# Patient Record
Sex: Female | Born: 2005 | Hispanic: Yes | Marital: Single | Smoking: Never smoker
Health system: Southern US, Community
[De-identification: ages and names within clinical notes are randomized; demographics above are authoritative.]

## PROBLEM LIST (undated history)

## (undated) ENCOUNTER — Ambulatory Visit: Admission: EM

## (undated) DIAGNOSIS — G43909 Migraine, unspecified, not intractable, without status migrainosus: Secondary | ICD-10-CM

## (undated) DIAGNOSIS — J45909 Unspecified asthma, uncomplicated: Secondary | ICD-10-CM

## (undated) HISTORY — DX: Unspecified asthma, uncomplicated: J45.909

## (undated) HISTORY — DX: Migraine, unspecified, not intractable, without status migrainosus: G43.909

## (undated) HISTORY — PX: NO PAST SURGERIES: SHX2092

---
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
## 2023-02-14 ENCOUNTER — Other Ambulatory Visit: Payer: Self-pay

## 2023-02-14 ENCOUNTER — Encounter: Payer: Self-pay | Admitting: Emergency Medicine

## 2023-02-14 ENCOUNTER — Emergency Department
Admission: EM | Admit: 2023-02-14 | Discharge: 2023-02-14 | Disposition: A | Discharge status: Home / Self Care | Payer: Medicaid Other | Attending: Emergency Medicine | Admitting: Emergency Medicine

## 2023-02-14 ENCOUNTER — Emergency Department: Payer: Medicaid Other

## 2023-02-14 DIAGNOSIS — R55 Syncope and collapse: Secondary | ICD-10-CM

## 2023-02-14 DIAGNOSIS — E876 Hypokalemia: Secondary | ICD-10-CM | POA: Diagnosis not present

## 2023-02-14 DIAGNOSIS — Z1152 Encounter for screening for COVID-19: Secondary | ICD-10-CM | POA: Insufficient documentation

## 2023-02-14 DIAGNOSIS — G43819 Other migraine, intractable, without status migrainosus: Secondary | ICD-10-CM

## 2023-02-14 LAB — CBC
HCT: 42.9 % (ref 36.0–46.0)
Hemoglobin: 14.4 g/dL (ref 12.0–15.0)
MCH: 28.9 pg (ref 26.0–34.0)
MCHC: 33.6 g/dL (ref 30.0–36.0)
MCV: 86.1 fL (ref 80.0–100.0)
Platelets: 181 10*3/uL (ref 150–400)
RBC: 4.98 MIL/uL (ref 3.87–5.11)
RDW: 12.2 % (ref 11.5–15.5)
WBC: 7.3 10*3/uL (ref 4.0–10.5)
nRBC: 0 % (ref 0.0–0.2)

## 2023-02-14 LAB — BASIC METABOLIC PANEL
Anion gap: 11 (ref 5–15)
BUN: 10 mg/dL (ref 6–20)
CO2: 24 mmol/L (ref 22–32)
Calcium: 9.4 mg/dL (ref 8.9–10.3)
Chloride: 102 mmol/L (ref 98–111)
Creatinine, Ser: 0.82 mg/dL (ref 0.44–1.00)
GFR, Estimated: >60 mL/min (ref >60)
Glucose, Bld: 97 mg/dL (ref 70–99)
Potassium: 3.2 mmol/L — ABNORMAL LOW (ref 3.5–5.1)
Sodium: 137 mmol/L (ref 135–145)

## 2023-02-14 LAB — RESP PANEL BY RT-PCR (RSV, FLU A&B, COVID)  RVPGX2
Influenza A by PCR: NEGATIVE
Influenza B by PCR: NEGATIVE
Resp Syncytial Virus by PCR: NEGATIVE
SARS Coronavirus 2 by RT PCR: NEGATIVE

## 2023-02-14 LAB — CBG MONITORING, ED: Glucose-Capillary: 84 mg/dL (ref 70–99)

## 2023-02-14 LAB — URINALYSIS, ROUTINE W REFLEX MICROSCOPIC
Bilirubin Urine: NEGATIVE
Glucose, UA: NEGATIVE mg/dL
Hgb urine dipstick: NEGATIVE
Ketones, ur: 20 mg/dL — AB
Leukocytes,Ua: NEGATIVE
Nitrite: NEGATIVE
Protein, ur: NEGATIVE mg/dL
Specific Gravity, Urine: 1.009 (ref 1.005–1.030)
pH: 5 (ref 5.0–8.0)

## 2023-02-14 LAB — HEPATIC FUNCTION PANEL
ALT: 19 U/L (ref 0–44)
AST: 23 U/L (ref 15–41)
Albumin: 4.6 g/dL (ref 3.5–5.0)
Alkaline Phosphatase: 49 U/L (ref 38–126)
Bilirubin, Direct: <0.1 mg/dL (ref 0.0–0.2)
Total Bilirubin: 0.7 mg/dL (ref 0.3–1.2)
Total Protein: 8 g/dL (ref 6.5–8.1)

## 2023-02-14 LAB — POC URINE PREG, ED: Preg Test, Ur: NEGATIVE

## 2023-02-14 MED ORDER — POTASSIUM CHLORIDE CRYS ER 20 MEQ PO TBCR
40.0000 meq | EXTENDED_RELEASE_TABLET | Freq: Once | ORAL | Status: AC
  Administered 2023-02-14: 40 meq via ORAL
  Filled 2023-02-14: qty 2

## 2023-02-14 MED ORDER — KETOROLAC TROMETHAMINE 15 MG/ML IJ SOLN
15.0000 mg | Freq: Once | INTRAMUSCULAR | Status: AC
  Administered 2023-02-14: 15 mg via INTRAVENOUS
  Filled 2023-02-14: qty 1

## 2023-02-14 MED ORDER — DIPHENHYDRAMINE HCL 50 MG/ML IJ SOLN
12.5000 mg | Freq: Once | INTRAMUSCULAR | Status: AC
  Administered 2023-02-14: 12.5 mg via INTRAVENOUS
  Filled 2023-02-14: qty 1

## 2023-02-14 MED ORDER — SODIUM CHLORIDE 0.9 % IV BOLUS
1000.0000 mL | Freq: Once | INTRAVENOUS | Status: AC
  Administered 2023-02-14: 1000 mL via INTRAVENOUS

## 2023-02-14 MED ORDER — ACETAMINOPHEN 500 MG PO TABS
1000.0000 mg | ORAL_TABLET | Freq: Once | ORAL | Status: AC
  Administered 2023-02-14: 1000 mg via ORAL
  Filled 2023-02-14: qty 2

## 2023-02-14 MED ORDER — METOCLOPRAMIDE HCL 5 MG/ML IJ SOLN
10.0000 mg | Freq: Once | INTRAMUSCULAR | Status: AC
  Administered 2023-02-14: 10 mg via INTRAVENOUS
  Filled 2023-02-14: qty 2

## 2023-02-14 NOTE — ED Provider Notes (Signed)
[facility] Provider Note    Event Date/Time   First MD Initiated Contact with Patient 02/14/23 1605     (approximate)   History   Loss of Consciousness   HPI  Tara Pierce is a 17 y.o. female who comes in after having a syncopal episode around 2pm today  Patient has been trying to fast to try and lose weight to get in the [employer]-she has not ate anything today, drank 1 cup of coffee.  She then participated in a heavy workout, [employer] around 10AM and did not drink any water. She then didn't eat lunch.  After lunch, she was in class and she started to feel a little nauseous but denies any headaches and she reports that she think she lost consciousness and laid her head down on the desk.  After she came to she tried to stand up but felt really weak so they lowered her down on to the ground.  She then reports developing a headache. She reports not having LOC at that time and feeling her body shaking but being aware of it.  She was able to hear everything but had difficulty speaking and had tingling of her whole body except her left arm.  She reports that the tingling seems to be resolving except for still little bit on her bilateral legs (left thigh, right foot) and her right arm.  She reports that she feels some weakness in her right arm as well.  She does report history of migraines previously but that she is never needed to come to the ER for.  She reports that typically they do cause some right arm weakness and tingling similar to what she currently has now but she is never had her legs feel tingly with it.  She denies any other injuries to her neck but reports some chronic neck pain for years. Denies any new injuries to neck.     Physical Exam   Triage Vital Signs: ED Triage Vitals  Enc Vitals Group     BP 02/14/23 1557 (!) 130/93     Pulse Rate 02/14/23 1557 80     Resp 02/14/23 1557 18     Temp 02/14/23 1557 98.6 F (37 C)     Temp Source 02/14/23 1557  Oral     SpO2 02/14/23 1557 100 %     Weight --      Height --      Head Circumference --      Peak Flow --      Pain Score 02/14/23 1558 0     Pain Loc --      Pain Edu? --      Excl. in [place]? --     Most recent vital signs: Vitals:   02/14/23 1557  BP: (!) 130/93  Pulse: 80  Resp: 18  Temp: 98.6 F (37 C)  SpO2: 100%     General: Awake, no distress.  CV:  Good peripheral perfusion.  Resp:  Normal effort.  Abd:  No distention. Soft and non tender Other:  Patient has some weakness in the right arm as well as some subjective sensation changes in the right arm and bilateral legs.  Otherwise cranial nerves appear intact.   ED Results / Procedures / Treatments   Labs (all labs ordered are listed, but only abnormal results are displayed) Labs Reviewed  BASIC METABOLIC PANEL - Abnormal; Notable for the following components:      Result Value  Potassium 3.2 (*)    All other components within normal limits  RESP PANEL BY RT-PCR (RSV, FLU A&B, COVID)  RVPGX2  CBC  URINALYSIS, ROUTINE W REFLEX MICROSCOPIC  HEPATIC FUNCTION PANEL  CBG MONITORING, ED  POC URINE PREG, ED     EKG  My interpretation of EKG:  Sinus rate of 83 without any ST elevation or T wave inversions with sinus arrhythmia  RADIOLOGY I have reviewed the CT had personally interpreted and no evidence of intracranial hemorrhage  PROCEDURES:  Critical Care performed: No  .1-3 Lead EKG Interpretation  Performed by: Vanessa Landen, MD Authorized by: Vanessa C[place]wba, MD     Interpretation: normal     ECG rate:  80   ECG rate assessment: normal     Rhythm: sinus rhythm     Ectopy: none     Conduction: normal      MEDICATIONS ORDERED IN ED: Medications  potassium chloride SA (KLOR-CON M) CR tablet 40 mEq (40 mEq Oral Given 02/14/23 1638)  sodium chloride 0.9 % bolus 1,000 mL (0 mLs Intravenous Stopped 02/14/23 1819)  metoCLOPramide (REGLAN) injection 10 mg (10 mg Intravenous Given 02/14/23 1718)   diphenhydrAMINE (BENADRYL) injection 12.5 mg (12.5 mg Intravenous Given 02/14/23 1718)  ketorolac (TORADOL) 15 MG/ML injection 15 mg (15 mg Intravenous Given 02/14/23 1920)  acetaminophen (TYLENOL) tablet 1,000 mg (1,000 mg Oral Given 02/14/23 1919)     IMPRESSION / MDM / ASSESSMENT AND PLAN / ED COURSE  I reviewed the triage vital signs and the nursing notes.   Patient's presentation is most consistent with acute presentation with potential threat to life or bodily function.  Patient comes in with concern for syncope.  Stroke code was not called given her examination seems more consistent with complex migraine and her neuroexam is affecting areas that would not be consistent with a stroke with some sensation changes on her left thigh right foot and right arm.  However given the headache I will get CT head to rule out intracranial hemorrhage given it has been within 6 hours of onset of headache.   however have a stronger suspicion for complex migraine that was brought upon by dehydration and fasting given she reports similar symptoms of R arm weakness/tingling and headaches previously.  Patient will be given Reglan cocktail.  Glucose was 84.  BMP shows slightly low potassium at 3.2.  CBC reassuring  5:58 PM reevaluated patient weakness was resolved and the right arm weakness was resolved in the right leg.  Sensation is also resolved except for a little bit of tingling in her bilateral toes.  Pregnancy test is negative.  Urine is reassuring.  7:31 PM the patient continues to have normal neurological examination at this time.  She will follow-up outpatient with neurology.  Do not feel that we can do MRI given symptoms have since all resolved this seems more likely complex migraine.  Given resolution of symptoms do not feel that this represents a dissection.  She denies any new neck injuries.  I really think this is a complex migraine related to not eating or drinking and having exercising.  7:47  PM patient denies neurological symptoms.  She is now eating and feels much better.  Will do ambulatory trial and discharge home.  Patient reports feeling at her baseline self.  The patient is on the cardiac monitor to evaluate for evidence of arrhythmia and/or significant heart rate changes.      FINAL CLINICAL IMPRESSION(S) / ED DIAGNOSES  Final diagnoses:  Syncope and collapse  Other migraine without status migrainosus, intractable     Rx / DC Orders   ED Discharge Orders     None        Note:  This document was prepared using Dragon voice recognition software and may include unintentional dictation errors.   Vanessa [place], MD 02/14/23 [phone]

## 2023-02-14 NOTE — Discharge Instructions (Addendum)
I suspect that this happened due to your dieting not eating or drinking while working out heavily.  It is important that you eat a balanced diet and drink fluids especially with working out.  I still suspect you have something called complex migraines and I have placed a number for neurology.  Please call them to make a follow-up appointment however to confirm this and rule out other causes if needed and return to the ER if you develop return of syncopal episodes, numbness, weakness or any other concerns

## 2023-02-14 NOTE — ED Triage Notes (Signed)
Patient to ED via [facility] from school after a syncopal episode. Patient states she has been fasting to try and lose weight to get into the [employer]- has not ate today. C/o headache, nausea, and numbness in arms and legs. Patient states that she sometimes gets a migraine and makes her "fall out."

## 2023-02-14 NOTE — ED Notes (Signed)
Pt to CT

## 2023-02-14 NOTE — ED Notes (Signed)
ED Provider at bedside. 

## 2023-03-12 ENCOUNTER — Other Ambulatory Visit: Payer: Self-pay | Admitting: Student

## 2023-03-12 DIAGNOSIS — G43109 Migraine with aura, not intractable, without status migrainosus: Secondary | ICD-10-CM

## 2023-03-17 ENCOUNTER — Encounter: Payer: Self-pay | Admitting: Neurology

## 2023-03-22 ENCOUNTER — Ambulatory Visit
Admission: RE | Admit: 2023-03-22 | Discharge: 2023-03-22 | Disposition: A | Discharge status: Home / Self Care | Payer: Medicaid Other | Source: Ambulatory Visit | Attending: Student | Admitting: Student

## 2023-03-22 DIAGNOSIS — G43109 Migraine with aura, not intractable, without status migrainosus: Secondary | ICD-10-CM

## 2023-06-03 ENCOUNTER — Other Ambulatory Visit: Payer: Self-pay

## 2023-06-03 ENCOUNTER — Emergency Department
Admission: EM | Admit: 2023-06-03 | Discharge: 2023-06-03 | Disposition: A | Discharge status: Home / Self Care | Payer: Medicaid Other | Attending: Emergency Medicine | Admitting: Emergency Medicine

## 2023-06-03 DIAGNOSIS — G43909 Migraine, unspecified, not intractable, without status migrainosus: Secondary | ICD-10-CM | POA: Diagnosis not present

## 2023-06-03 DIAGNOSIS — R519 Headache, unspecified: Secondary | ICD-10-CM

## 2023-06-03 DIAGNOSIS — Z1152 Encounter for screening for COVID-19: Secondary | ICD-10-CM | POA: Diagnosis not present

## 2023-06-03 LAB — SARS CORONAVIRUS 2 BY RT PCR: SARS Coronavirus 2 by RT PCR: NEGATIVE

## 2023-06-03 LAB — PREGNANCY, URINE: Preg Test, Ur: NEGATIVE

## 2023-06-03 MED ORDER — SODIUM CHLORIDE 0.9 % IV BOLUS
1000.0000 mL | Freq: Once | INTRAVENOUS | Status: AC
  Administered 2023-06-03: 1000 mL via INTRAVENOUS

## 2023-06-03 MED ORDER — METOCLOPRAMIDE HCL 5 MG/ML IJ SOLN
10.0000 mg | Freq: Once | INTRAMUSCULAR | Status: AC
  Administered 2023-06-03: 10 mg via INTRAVENOUS
  Filled 2023-06-03: qty 2

## 2023-06-03 MED ORDER — KETOROLAC TROMETHAMINE 30 MG/ML IJ SOLN
30.0000 mg | Freq: Once | INTRAMUSCULAR | Status: AC
  Administered 2023-06-03: 30 mg via INTRAVENOUS
  Filled 2023-06-03: qty 1

## 2023-06-03 MED ORDER — DIPHENHYDRAMINE HCL 50 MG/ML IJ SOLN
50.0000 mg | Freq: Once | INTRAMUSCULAR | Status: AC
  Administered 2023-06-03: 50 mg via INTRAVENOUS
  Filled 2023-06-03: qty 1

## 2023-06-03 NOTE — ED Triage Notes (Signed)
Pt presents to ER with c/o posterior HA that started around 0100.  Pt reports she had a "knot" to left upper neck that when she pressed on, caused her to feel numbness/tingling all over her body.  Pt states she is still having some tingling all over, but states that she does have full sensation at this time.  Pt endorses hx of migraines, and takes rizatriptan.  States she took a dose when her HA started.  Pt endorses nausea, and sensitivity to light/sound at this time.  Pt otherwise A&O x4 and in NAD.

## 2023-06-03 NOTE — ED Provider Notes (Signed)
[facility] Provider Note    Event Date/Time   First MD Initiated Contact with Patient 06/03/23 [phone]     (approximate)  History   Chief Complaint: Headache  HPI  Tara Pierce is a 17 y.o. female with a past medical history of migraines who presents emergency department for headache.  According to the patient since around 1:00 this morning she has been experiencing a headache along with nausea and vomiting photo and phonophobia.  Patient states this feels typical of her migraines but is more severe than her typical migraine.  Patient states she tried her home medications without relief so she came to the emergency department.  Patient states she has had to come to the emergency department previously and mom states the last of which was approximately 2 months ago for headache.  States she responds well to the "migraine cocktail" medications.  Patient denies any recent fever cough or congestion.  Denies any weakness or numbness of either arm or legs but does state at times paresthesias tingling which is also typical of her migraines.  Physical Exam   Triage Vital Signs: ED Triage Vitals  Enc Vitals Group     BP 06/03/23 0214 123/89     Pulse Rate 06/03/23 0214 88     Resp 06/03/23 0214 19     Temp 06/03/23 0214 98.6 F (37 C)     Temp Source 06/03/23 0214 Oral     SpO2 06/03/23 0214 98 %     Weight 06/03/23 0215 190 lb (86.2 kg)     Height 06/03/23 0215 5\' 3"  (1.6 m)     Head Circumference --      Peak Flow --      Pain Score 06/03/23 0215 10     Pain Loc --      Pain Edu? --      Excl. in [place]? --     Most recent vital signs: Vitals:   06/03/23 0214  BP: 123/89  Pulse: 88  Resp: 19  Temp: 98.6 F (37 C)  SpO2: 98%    General: Awake, no distress.  CV:  Good peripheral perfusion.  Regular rate and rhythm  Resp:  Normal effort.  Equal breath sounds bilaterally.  Abd:  No distention.  Soft, nontender.  No rebound or  guarding. Other:  Equal grip strength bilaterally.  No cranial nerve deficits.   ED Results / Procedures / Treatments   MEDICATIONS ORDERED IN ED: Medications  ketorolac (TORADOL) 30 MG/ML injection 30 mg (has no administration in time range)  metoCLOPramide (REGLAN) injection 10 mg (has no administration in time range)  diphenhydrAMINE (BENADRYL) injection 50 mg (has no administration in time range)  sodium chloride 0.9 % bolus 1,000 mL (has no administration in time range)     IMPRESSION / MDM / ASSESSMENT AND PLAN / ED COURSE  I reviewed the triage vital signs and the nursing notes.  Patient's presentation is most consistent with acute presentation with potential threat to life or bodily function.  Patient presents to the emergency department for headache.  History of migraines with migraine symptoms tonight.  Overall the patient appears well, reassuring physical exam reassuring neurological exam.  We will treat with Toradol Reglan Benadryl and continue to closely monitor.  We will IV hydrate.  Given the patient's headache we will also obtain a COVID swab as a precaution given a recent uptick in the community.  She states she is feeling much better after medications.  Does not want to wait for the COVID swab to resolve and is ready to go home.  Will discharge patient home with outpatient follow-up.  Patient agreeable to plan of care.  FINAL CLINICAL IMPRESSION(S) / ED DIAGNOSES   Migraine headache  Note:  This document was prepared using Dragon voice recognition software and may include unintentional dictation errors.   Minna Antis, MD 06/03/23 [phone]

## 2023-06-25 ENCOUNTER — Other Ambulatory Visit: Payer: Self-pay | Admitting: Student

## 2023-06-25 DIAGNOSIS — R519 Headache, unspecified: Secondary | ICD-10-CM

## 2024-06-25 ENCOUNTER — Ambulatory Visit

## 2024-06-25 VITALS — BP 123/73 | Ht 63.0 in | Wt 213.0 lb

## 2024-06-25 DIAGNOSIS — Z309 Encounter for contraceptive management, unspecified: Secondary | ICD-10-CM

## 2024-06-25 DIAGNOSIS — Z3201 Encounter for pregnancy test, result positive: Secondary | ICD-10-CM

## 2024-06-25 LAB — PREGNANCY, URINE: Preg Test, Ur: POSITIVE — AB

## 2024-06-25 MED ORDER — PRENATAL 27-0.8 MG PO TABS: 1.0000 | ORAL_TABLET | Freq: Every day | ORAL | Status: AC

## 2024-06-25 NOTE — Progress Notes (Signed)
 UPT positive. Plans prenatal care at [facility]. Positive preg packet given and reviewed.   Patient states LMP approx 02/14/2024. She cannot recall. Taking ocp on and off for approx 3 yrs. Thinks she took last ocp 03/2024, but unsure. Reports having neg home UPT on 04/12/24. Patient is poor historian and unsure of dates.  Unable to accurately date preg today. Will most likely need ultrasound to date preg.  Patient states she took nausea med 2 weeks ago that had been prescribed by [employer]. She cannot recall name of med. Advised patient to contact prescribing provider regarding its safety in pregnancy and guidance in pregnancy. List of safe medications in pregnancy' given and reviewed.   Patient decides at end of nurse visit, she would like prenatal care at [facility]. RN walked patient to clerk to schedule next available new OB appt and to discuss elig for medicaid/preg women. Keghan Mcfarren, RN

## 2024-07-02 ENCOUNTER — Ambulatory Visit: Admitting: Nurse Practitioner

## 2024-07-02 ENCOUNTER — Encounter: Payer: Self-pay | Admitting: Nurse Practitioner

## 2024-07-02 ENCOUNTER — Telehealth: Payer: Self-pay

## 2024-07-02 VITALS — BP 111/73 | HR 89 | Temp 97.9°F | Wt 210.8 lb

## 2024-07-02 DIAGNOSIS — Z3400 Encounter for supervision of normal first pregnancy, unspecified trimester: Secondary | ICD-10-CM

## 2024-07-02 DIAGNOSIS — O99210 Obesity complicating pregnancy, unspecified trimester: Secondary | ICD-10-CM

## 2024-07-02 DIAGNOSIS — Z8669 Personal history of other diseases of the nervous system and sense organs: Secondary | ICD-10-CM | POA: Insufficient documentation

## 2024-07-02 DIAGNOSIS — Z59819 Housing instability, housed unspecified: Secondary | ICD-10-CM | POA: Insufficient documentation

## 2024-07-02 DIAGNOSIS — O219 Vomiting of pregnancy, unspecified: Secondary | ICD-10-CM

## 2024-07-02 LAB — WET PREP FOR TRICH, YEAST, CLUE
Clue Cell Exam: NEGATIVE
Trichomonas Exam: NEGATIVE
Yeast Exam: NEGATIVE

## 2024-07-02 LAB — HEMOGLOBIN, FINGERSTICK: Hemoglobin: 13 g/dL (ref 11.1–15.9)

## 2024-07-02 MED ORDER — METOCLOPRAMIDE HCL 10 MG PO TABS: 10.0000 mg | ORAL_TABLET | Freq: Three times a day (TID) | ORAL | 0 refills | Status: DC | PRN

## 2024-07-02 NOTE — Progress Notes (Signed)
 [facility] [address], [address] [place] [address] Main phone: [phone]  Initial Prenatal Visit  Subjective:  Tara Pierce is a 18 y.o. G1P0000 at unknown GA being seen today to start prenatal care at the [facility]. The following medical issues will be considered in the care of this low-risk pregnancy:   Patient Active Problem List   Diagnosis Date Noted   Supervision of normal first pregnancy, antepartum 07/02/2024   History of migraine 07/02/2024   Housing situation unstable 07/02/2024   Obesity affecting pregnancy, antepartum 07/02/2024   Patient reports nausea and vomiting.  She reports that n/v began 3 weeks ago. The ED gave her a medication for this but she cannot recall the name. She reports n/v has improved recently and she is able to keep water and most food down. Contractions: Not present. Vag. Bleeding: None.  Movement: Absent. Denies leaking of fluid.   Indications for ASA therapy One of the following: Previous pregnancy with preeclampsia, especially early onset and with an adverse outcome No  Multifetal gestation No  Chronic hypertension No  Type 1 or 2 diabetes mellitus No  Chronic kidney disease No  Autoimmune disease (antiphospholipid syndrome, systemic lupus erythematosus) No   Two or more of the following: Nulliparity  Yes  Obesity (body mass index >30 kg/m2) Yes  Family history of preeclampsia in mother or sister No  Age >=35 years No  Sociodemographic characteristics (African American race, low socioeconomic level) Yes  Personal risk factors (eg, previous pregnancy with low birth weight or small for gestational age infant, previous adverse pregnancy outcome [eg, stillbirth], interval >10 years between pregnancies) No   The following portions of the patient's history were reviewed and updated as appropriate: allergies, current medications, past family history,  past medical history, past social history, past surgical history and problem list. Problem list updated.  Objective:   Vitals:   07/02/24 0859  BP: 111/73  Pulse: 89  Temp: 97.9 F (36.6 C)  Weight: 210 lb 12.8 oz (95.6 kg)   Fetal Status: Fetal Heart Rate (bpm):  (too early) Fundal Height:  (too early) Movement: Absent      Physical Exam Vitals and nursing note reviewed.  Constitutional:      Appearance: Normal appearance.  HENT:     Head: Normocephalic.     Mouth/Throat:     Mouth: Mucous membranes are moist.     Dentition: No dental caries or gum lesions.     Comments: Dentition: intact Cardiovascular:     Rate and Rhythm: Normal rate.     Heart sounds: Normal heart sounds.  Pulmonary:     Effort: Pulmonary effort is normal.     Breath sounds: Normal breath sounds.  Chest:     Comments: Exam not necessary Abdominal:     Palpations: Abdomen is soft.  Genitourinary:    Comments: Declined genital exam- no symptoms, self swabbed Musculoskeletal:        General: Normal range of motion.  Lymphadenopathy:     Head:     Right side of head: No submandibular, preauricular or posterior auricular adenopathy.     Left side of head: No submandibular, preauricular or posterior auricular adenopathy.     Cervical: No cervical adenopathy.     Upper Body:     Right upper body: No supraclavicular or axillary adenopathy.     Left upper body: No supraclavicular or axillary adenopathy.  Skin:    General:  Skin is warm and dry.  Neurological:     Mental Status: She is alert and oriented to person, place, and time.  Psychiatric:        Mood and Affect: Mood normal.        Behavior: Behavior normal.     Assessment and Plan:  Pregnancy: G1P0000 at unknown GA  1. Supervision of normal first pregnancy, antepartum (Primary) Reviewed recommended weight gain: 11-20lbs. Discussed healthy diet (avoiding soda, good sources of protein, fruits, and vegetables, lots of water) and  exercising. Pap not due today  Last dental visit 4 years ago. Encouraged to seek dental care this pregnancy. Sent referral for dating US .  Pt needs ASA this pregnancy. Given pt sheet and explained, but did not dispense ASA or tell pt to start taking because of unknown GA I estimate she is between 9 and 11 weeks given that her n/v started 3 weeks ago and she had a negative pregnancy test on 04/12/24 and was unable to find FHR today.  - Hemoglobin, fingerstick - WET PREP FOR TRICH, YEAST, CLUE - Prenatal Profile I - Glucose, 1 hour gestational - Hgb A1c w/o eAG - Comprehensive metabolic panel - Protein / creatinine ratio, urine  (Spot) - TSH - Lead, blood (adult age 76 yrs or greater) - Hemoglobinopathy evaluation -[id number] - [id number] PLUS Core+ESS+SCA - US  OB LESS THAN 14 WEEKS WITH OB TRANSVAGINAL; Future  2. History of migraine Pt has extensive migraine history including a normal brain MRI and spine CT. She usually takes rizatriptan as needed. She has not taken it for about a month. Discussed that we do not have enough evidence of birth defects or to support sumatriptan use in pregnancy.  Discussed pregnancy headache cocktail. Meant to give pt a print out of these medications but was not able to during this visit. The cocktail should be at onset of migraine she should take all of these medications together: 10mg  reglan  (prescribed today, the rest are OTC), 500-800mg  magnesium, 200mg  vitamin B2 (riboflavin), 25-50mg  benadryl , 1000mg  acetaminophen   - metoCLOPramide  (REGLAN ) 10 MG tablet; Take 1 tablet (10 mg total) by mouth every 8 (eight) hours as needed for nausea or vomiting (migraine).  Dispense: 30 tablet; Refill: 0  3. Housing situation unstable Pt living with her friend, [place], right now. Has also lived with her brother.  She moved out of her mother's house because she had a job at [employer] and also thought she wanted to join [employer]. Then her job laid her off and she had a  positive pregnancy test. There is no animosity between her and her mother. Her family is supportive of this pregnancy. The FOB is in the picture, but is currently in bootcamp. Encouraged the pt to speak with her mother and decide if she should move back in.  4. Obesity affecting pregnancy, antepartum, unspecified obesity type 10 lb 12.8 oz (4.899 kg)  - Amb ref to Medical Nutrition Therapy-MNT  5. Nausea/vomiting in pregnancy Encouraged the pt to start on doxylamine and vit B6 for 1 week. If not relieved may take prescribed reglan .     Discussed overview of care and coordination with inpatient delivery practices including [facility],  [facility].   Reviewed Centering pregnancy as standard of care at [facility], oriented to room and showed video. Based on EDD, plan for Cycle unknown d/t unknown LMP .   Preterm labor symptoms and general obstetric precautions including but not limited to vaginal bleeding, contractions, leaking of fluid  and fetal movement were reviewed in detail with the patient.  Please refer to After Visit Summary for other counseling recommendations.   Return in about 4 weeks (around 07/30/2024) for Routine PNC.  Future Appointments  Date Time Provider Department Center  07/30/2024  9:00 AM [facility] PROVIDER [facility] None    Leita MARLA Bolognese, NP

## 2024-07-02 NOTE — Telephone Encounter (Signed)
 Called patient to give date, time and location and instructions of U/S (08/06 @ 1 p.m. [facility]). Left message with patient's mother for [place] to call Health Department. Consuelo Kingsley RN

## 2024-07-02 NOTE — Progress Notes (Addendum)
 In house labs reviewed during visit. Declined COVID vaccine in Pregnancy. Patient taking medication for migraine headaches but unsure of the name of the medicine. Consuelo Kingsley RN

## 2024-07-02 NOTE — Telephone Encounter (Signed)
 Patient returned phone call; gave instructions and directions of U/S 08/06 @ 1 pm. Patient verbalized understanding. Consuelo Kingsley RN

## 2024-07-03 LAB — PROTEIN / CREATININE RATIO, URINE
Creatinine, Urine: 164.2 mg/dL
Protein, Ur: 14 mg/dL
Protein/Creat Ratio: 85 mg/g{creat} (ref 0–200)

## 2024-07-05 ENCOUNTER — Ambulatory Visit: Payer: Self-pay

## 2024-07-05 DIAGNOSIS — Z3400 Encounter for supervision of normal first pregnancy, unspecified trimester: Secondary | ICD-10-CM

## 2024-07-06 NOTE — Progress Notes (Signed)
 Lab reviewed, wnl.   Dorothyann Helling, MD 07/06/24  9:20 AM

## 2024-07-07 ENCOUNTER — Ambulatory Visit: Admission: RE | Admit: 2024-07-07 | Source: Ambulatory Visit

## 2024-07-09 ENCOUNTER — Ambulatory Visit
Admission: RE | Admit: 2024-07-09 | Discharge: 2024-07-09 | Disposition: A | Discharge status: Home / Self Care | Source: Ambulatory Visit | Attending: Nurse Practitioner | Admitting: Nurse Practitioner

## 2024-07-09 DIAGNOSIS — Z3400 Encounter for supervision of normal first pregnancy, unspecified trimester: Secondary | ICD-10-CM | POA: Insufficient documentation

## 2024-07-09 DIAGNOSIS — Z3401 Encounter for supervision of normal first pregnancy, first trimester: Secondary | ICD-10-CM | POA: Insufficient documentation

## 2024-07-09 DIAGNOSIS — Z3A10 10 weeks gestation of pregnancy: Secondary | ICD-10-CM | POA: Diagnosis not present

## 2024-07-09 LAB — PREGNANCY, INITIAL SCREEN
Antibody Screen: NEGATIVE
Basophils Absolute: 0 x10E3/uL (ref 0.0–0.2)
Basos: 0 %
Bilirubin, UA: NEGATIVE
Chlamydia trachomatis, NAA: NEGATIVE
EOS (ABSOLUTE): 0.1 x10E3/uL (ref 0.0–0.4)
Eos: 1 %
Glucose, UA: NEGATIVE
HCV Ab: NONREACTIVE
HIV Screen 4th Generation wRfx: NONREACTIVE
Hematocrit: 40.7 % (ref 34.0–46.6)
Hemoglobin: 13.6 g/dL (ref 11.1–15.9)
Hepatitis B Surface Ag: NEGATIVE
Immature Grans (Abs): 0 x10E3/uL (ref 0.0–0.1)
Immature Granulocytes: 0 %
Ketones, UA: NEGATIVE
Leukocytes,UA: NEGATIVE
Lymphocytes Absolute: 1.1 x10E3/uL (ref 0.7–3.1)
Lymphs: 15 %
MCH: 30.2 pg (ref 26.6–33.0)
MCHC: 33.4 g/dL (ref 31.5–35.7)
MCV: 90 fL (ref 79–97)
Monocytes Absolute: 0.5 x10E3/uL (ref 0.1–0.9)
Monocytes: 7 %
Neisseria Gonorrhoeae by PCR: NEGATIVE
Neutrophils Absolute: 5.4 x10E3/uL (ref 1.4–7.0)
Neutrophils: 76 %
Nitrite, UA: NEGATIVE
Platelets: 170 x10E3/uL (ref 150–450)
RBC, UA: NEGATIVE
RBC: 4.5 x10E6/uL (ref 3.77–5.28)
RDW: 12.7 % (ref 11.7–15.4)
RPR Ser Ql: NONREACTIVE
Rh Factor: POSITIVE
Rubella Antibodies, IGG: 3.38 {index} (ref >0.99)
Specific Gravity, UA: 1.022 (ref 1.005–1.030)
Urobilinogen, Ur: 0.2 mg/dL (ref 0.2–1.0)
WBC: 7.1 x10E3/uL (ref 3.4–10.8)
pH, UA: 6.5 (ref 5.0–7.5)

## 2024-07-09 LAB — HGB FRACTIONATION CASCADE
Hgb A2: 2.5 % (ref 1.8–3.2)
Hgb A: 97.5 % (ref 96.4–98.8)
Hgb F: 0 % (ref 0.0–2.0)
Hgb S: 0 %

## 2024-07-09 LAB — MATERNIT21  PLUS CORE+ESS+SCA, BLOOD
11q23 deletion (Jacobsen): NOT DETECTED
15q11 deletion (PW Angelman): NOT DETECTED
1p36 deletion syndrome: NOT DETECTED
22q11 deletion (DiGeorge): NOT DETECTED
4p16 deletion(Wolf-Hirschhorn): NOT DETECTED
5p15 deletion (Cri-du-chat): NOT DETECTED
8q24 deletion (Langer-Giedion): NOT DETECTED
Fetal Fraction: 11
Monosomy X (Turner Syndrome): NOT DETECTED
Result (T21): NEGATIVE
Trisomy 13 (Patau syndrome): NEGATIVE
Trisomy 16: NOT DETECTED
Trisomy 18 (Edwards syndrome): NEGATIVE
Trisomy 21 (Down syndrome): NEGATIVE
Trisomy 22: NOT DETECTED
XXX (Triple X Syndrome): NOT DETECTED
XXY (Klinefelter Syndrome): NOT DETECTED
XYY (Jacobs Syndrome): NOT DETECTED

## 2024-07-09 LAB — COMPREHENSIVE METABOLIC PANEL WITH GFR
ALT: 15 IU/L (ref 0–32)
AST: 19 IU/L (ref 0–40)
Albumin: 4.5 g/dL (ref 4.0–5.0)
Alkaline Phosphatase: 57 IU/L (ref 42–106)
BUN/Creatinine Ratio: 12 (ref 9–23)
BUN: 7 mg/dL (ref 6–20)
Bilirubin Total: 0.2 mg/dL (ref 0.0–1.2)
CO2: 20 mmol/L (ref 20–29)
Calcium: 9.5 mg/dL (ref 8.7–10.2)
Chloride: 101 mmol/L (ref 96–106)
Creatinine, Ser: 0.58 mg/dL (ref 0.57–1.00)
Globulin, Total: 2.6 g/dL (ref 1.5–4.5)
Glucose: 81 mg/dL (ref 70–99)
Potassium: 4.1 mmol/L (ref 3.5–5.2)
Sodium: 137 mmol/L (ref 134–144)
Total Protein: 7.1 g/dL (ref 6.0–8.5)
eGFR: 134 mL/min/1.73 (ref >59)

## 2024-07-09 LAB — MICROSCOPIC EXAMINATION
Bacteria, UA: NONE SEEN
Casts: NONE SEEN /LPF
RBC, Urine: NONE SEEN /HPF (ref 0–2)
WBC, UA: NONE SEEN /HPF (ref 0–5)

## 2024-07-09 LAB — TSH: TSH: 3.12 u[IU]/mL (ref 0.450–4.500)

## 2024-07-09 LAB — HCV INTERPRETATION

## 2024-07-09 LAB — HGB A1C W/O EAG: Hgb A1c MFr Bld: 4.6 % — ABNORMAL LOW (ref 4.8–5.6)

## 2024-07-09 LAB — GLUCOSE, 1 HOUR GESTATIONAL: Gestational Diabetes Screen: 82 mg/dL (ref 70–139)

## 2024-07-09 LAB — LEAD, BLOOD (ADULT >= 16 YRS): Lead-Whole Blood: 2 ug/dL (ref 0.0–3.4)

## 2024-07-09 LAB — URINE CULTURE, OB REFLEX

## 2024-07-12 ENCOUNTER — Encounter: Payer: Self-pay | Admitting: Family Medicine

## 2024-07-12 ENCOUNTER — Other Ambulatory Visit: Payer: Self-pay | Admitting: Family Medicine

## 2024-07-12 DIAGNOSIS — O0930 Supervision of pregnancy with insufficient antenatal care, unspecified trimester: Secondary | ICD-10-CM | POA: Insufficient documentation

## 2024-07-12 NOTE — Progress Notes (Signed)
 Early US  8/08 shows GA [redacted]w[redacted]d. Will update chart and use US  for dating.   EDD by US  01/30/2025.  Dorothyann Helling, MD 07/12/24  9:48 AM

## 2024-07-12 NOTE — Progress Notes (Signed)
 A1c low, but patient asymptomatic. NO action needed. Normal Hgb, wet prep, urine P:C, early GTT, CMP, TSH, pre initial screen, UA, urine cx, cfDNA (c/w female), Hgb electro, lead.   Dorothyann Helling, MD 07/12/24  9:41 AM

## 2024-07-14 ENCOUNTER — Other Ambulatory Visit: Payer: Self-pay | Admitting: Family Medicine

## 2024-07-14 NOTE — Progress Notes (Signed)
 Dating and viability US . Viable live IUP [redacted]w[redacted]d.   Tara Helling, MD 07/14/24  5:52 PM

## 2024-07-14 NOTE — Progress Notes (Signed)
 Updated chart to reflect dating by US .   Tara Helling, MD 07/14/24  5:54 PM

## 2024-07-28 ENCOUNTER — Telehealth: Payer: Self-pay | Admitting: Family Medicine

## 2024-07-29 ENCOUNTER — Ambulatory Visit

## 2024-07-29 NOTE — Addendum Note (Signed)
 Addended by: Catherina Pates on: 07/29/2024 12:06 PM   Modules accepted: Orders

## 2024-07-30 ENCOUNTER — Telehealth: Payer: Self-pay

## 2024-07-30 ENCOUNTER — Ambulatory Visit: Admitting: Family Medicine

## 2024-07-30 VITALS — BP 100/65 | HR 79 | Temp 97.5°F | Wt 211.2 lb

## 2024-07-30 DIAGNOSIS — Z3400 Encounter for supervision of normal first pregnancy, unspecified trimester: Secondary | ICD-10-CM

## 2024-07-30 DIAGNOSIS — Z3A13 13 weeks gestation of pregnancy: Secondary | ICD-10-CM

## 2024-07-30 DIAGNOSIS — O99210 Obesity complicating pregnancy, unspecified trimester: Secondary | ICD-10-CM

## 2024-07-30 DIAGNOSIS — O99211 Obesity complicating pregnancy, first trimester: Secondary | ICD-10-CM

## 2024-07-30 DIAGNOSIS — Z3401 Encounter for supervision of normal first pregnancy, first trimester: Secondary | ICD-10-CM

## 2024-07-30 MED ORDER — ASPIRIN 81 MG PO TBEC: 81.0000 mg | DELAYED_RELEASE_TABLET | Freq: Every day | ORAL | 3 refills | Status: AC

## 2024-07-30 NOTE — Progress Notes (Addendum)
 Kept 07/07/24 US  appt at [facility]. Given pregnancy headache cocktail list (from problem list). Plans to participate in CenteringPregnancy Cycle 7. Case Manager [employer] Code) in room talking with client. Burnadette Lowers, RN

## 2024-07-30 NOTE — Telephone Encounter (Signed)
 Call to client to notify her of 09/10/24 1300 anatomy US  appt at the [facility]. Per scheduler, client to arrive at 1230 and by 1230 to have drunk 32 oz of water and not emptied bladder. Left message to call regarding US  appt and number to call provided (left message that agency closed Monday due to Labor Day). Burnadette Lowers, RN

## 2024-07-30 NOTE — Telephone Encounter (Signed)
 Returned patient phone call. Notified of date, time and location of upcoming U/S 09/10/2024. Instructions given and verbalized understanding. Consuelo Kingsley RN

## 2024-07-30 NOTE — Patient Instructions (Signed)
 Pregnancy Continue taking your prenatal vitamin daily and aspirin  81 mg daily.  Please schedule your next prenatal visit for about 4 weeks  from now.  Please visit WIC (women's, infants, and children program) to see about their breast feeding peer support program. You can start this program to get tips and tricks for successful breast feeding of your baby.   Prenatal Classes If delivering at [facility] with [facility] or [facility]: Go to [email or web address]   If delivering at [facility]: Go to [email or web address] and search for: "Pregnancy and Parenting Classes" "Prepared Childbirth Classes"  Resource regarding exposures that could affect pregnancy: Mother To Natale [employer] is a Dentist with lots of information on the effects of many medications and exposures on your pregnancy. It is free to use, including their web site, phone line, text service, app, or email and live chat.  [email or web address]/ Email or live chat: MotherToBaby.org Phone: [phone] Text: [phone] App: search "L[employer]"   Maternal Mental Health If you start to develop the below symptoms of depression, please reach out to us  for an appointment. There is also a Biomedical scientist Health Hotline at [phone]). This hotline has trained counselors, doulas, and midwifes to real-time support, information, and resources.  Feeling sad or hopeless most of the time Lack of interest in things you used to enjoy Less interest in caring for yourself (dressing, fixing hair) Trouble concentrating Trouble coping with daily tasks Constant worry about your baby Sleeping or eating too much or too little Feeling very anxious or nervous Unexplained irritability or anger Unwanted or scary thoughts Feeling that you are not a good mother Thoughts of hurting yourself or your baby  If you feel you are experiencing a  mental health crisis, please reach out to the National Suicide Prevention Hotline at 1-800-273-TALK 312-582-7491).

## 2024-08-08 NOTE — Progress Notes (Signed)
  [facility] [address] [place] [address] Main phone: [phone]  Prenatal Visit  Subjective:  Tara Pierce is a 18 y.o. G1P0000 at [redacted]w[redacted]d being seen today for ongoing prenatal care.  She is currently monitored for the following issues for this low-risk pregnancy:   Patient Active Problem List   Diagnosis Date Noted   Supervision of normal first pregnancy, antepartum 07/02/2024   History of migraine 07/02/2024   Housing situation unstable 07/02/2024   Obesity affecting pregnancy, pre-gravid BMI 35 07/02/2024   Patient reports headache.   .  .   . Denies leaking of fluid/ROM.   The following portions of the patient's history were reviewed and updated as appropriate: allergies, current medications, past family history, past medical history, past social history, past surgical history and problem list. Problem list updated.  Objective:   Vitals:   07/30/24 0910  BP: 100/65  Pulse: 79  Temp: (!) 97.5 F (36.4 C)  Weight: 211 lb 3.2 oz (95.8 kg)   Fetal Status:           General:  Alert, oriented and cooperative. Patient is in no acute distress.  Skin: Skin is warm and dry. No rash noted.   Cardiovascular: Normal heart rate noted  Respiratory: Normal respiratory effort, no problems with respiration noted  Abdomen: Soft, gravid, appropriate for gestational age.        Pelvic: Cervical exam deferred        Extremities: Normal range of motion.     Mental Status: Normal mood and affect. Normal behavior. Normal judgment and thought content.   Assessment and Plan:  Pregnancy: G1P0000 at [redacted]w[redacted]d  [redacted] weeks gestation of pregnancy  Supervision of normal first pregnancy, antepartum Assessment & Plan: Doing well. BP normal. TWG 11 lb 3.2 oz (5.08 kg). Taking prenatal vitamin daily and aspirin  81 mg daily. Next prenatal appointment in 4 weeks .  - plan for anatomy US  18-[redacted]w[redacted]d gestation  Discussed or  disclosed in AVS: - Breast feeding support through [facility] breast feeding peer counselor program - Mood resources, including 988, Maternal Mental Health Line, and counselor Alan Hail, LCSW, here at [facility] - Prenatal classes  Orders: -     Inheritest 300 PLUS Panel -     Aspirin ; Take 1 tablet (81 mg total) by mouth daily. Swallow whole.  Dispense: 90 tablet; Refill: 3 -     US  OB Comp + 14 Wk; Future  Obesity affecting pregnancy, antepartum, unspecified obesity type Assessment & Plan: Tolerating aspirin  every other day. Nutrition referral placed 07/02/24.   Preterm labor symptoms and general obstetric precautions including but not limited to vaginal bleeding, contractions, leaking of fluid and fetal movement were reviewed in detail with the patient. Please refer to After Visit Summary for other counseling recommendations.  Return in about 4 weeks (around 08/27/2024) for next prenatal appointment.  Future Appointments  Date Time Provider Department Center  08/24/2024 10:20 AM [facility] PROVIDER [facility] None  08/26/2024  1:40 PM [facility] PROVIDER [facility] None  09/10/2024  1:00 PM [facility]  3 [facility]   Betsey CHRISTELLA Helling, MD

## 2024-08-08 NOTE — Assessment & Plan Note (Signed)
 Doing well. BP normal. TWG 11 lb 3.2 oz (5.08 kg). Taking prenatal vitamin daily and aspirin  81 mg daily. Next prenatal appointment in 4 weeks .  - plan for anatomy US  18-[redacted]w[redacted]d gestation  Discussed or disclosed in AVS: - Breast feeding support through [facility] breast feeding peer counselor program - Mood resources, including 988, Maternal Mental Health Line, and counselor Alan Hail, [place], here at [facility] - Prenatal classes

## 2024-08-08 NOTE — Assessment & Plan Note (Signed)
 Tolerating aspirin  every other day. Nutrition referral placed 07/02/24.

## 2024-08-10 LAB — BEACON CARRIER EXPD GENE 427

## 2024-08-11 ENCOUNTER — Ambulatory Visit: Payer: Self-pay | Admitting: Family Medicine

## 2024-08-11 DIAGNOSIS — Z3400 Encounter for supervision of normal first pregnancy, unspecified trimester: Secondary | ICD-10-CM

## 2024-08-11 NOTE — Progress Notes (Signed)
 Negative carrier screening.  Tara Helling, MD 08/11/24  4:26 PM

## 2024-08-23 NOTE — Addendum Note (Signed)
 Addended by: Carys Malina on: 08/23/2024 12:14 PM   Modules accepted: Orders

## 2024-08-24 ENCOUNTER — Encounter: Payer: Self-pay | Admitting: Emergency Medicine

## 2024-08-24 ENCOUNTER — Other Ambulatory Visit: Payer: Self-pay

## 2024-08-24 ENCOUNTER — Emergency Department
Admission: EM | Admit: 2024-08-24 | Discharge: 2024-08-25 | Disposition: A | Discharge status: Home / Self Care | Attending: Emergency Medicine | Admitting: Emergency Medicine

## 2024-08-24 ENCOUNTER — Ambulatory Visit: Admitting: Family Medicine

## 2024-08-24 ENCOUNTER — Encounter: Payer: Self-pay | Admitting: Family Medicine

## 2024-08-24 VITALS — BP 115/87 | HR 84 | Temp 98.0°F | Wt 210.2 lb

## 2024-08-24 DIAGNOSIS — O26892 Other specified pregnancy related conditions, second trimester: Secondary | ICD-10-CM | POA: Insufficient documentation

## 2024-08-24 DIAGNOSIS — R202 Paresthesia of skin: Secondary | ICD-10-CM | POA: Insufficient documentation

## 2024-08-24 DIAGNOSIS — R55 Syncope and collapse: Secondary | ICD-10-CM | POA: Diagnosis not present

## 2024-08-24 DIAGNOSIS — F419 Anxiety disorder, unspecified: Secondary | ICD-10-CM

## 2024-08-24 DIAGNOSIS — Z3490 Encounter for supervision of normal pregnancy, unspecified, unspecified trimester: Secondary | ICD-10-CM

## 2024-08-24 DIAGNOSIS — Z3402 Encounter for supervision of normal first pregnancy, second trimester: Secondary | ICD-10-CM

## 2024-08-24 DIAGNOSIS — Z3A17 17 weeks gestation of pregnancy: Secondary | ICD-10-CM | POA: Diagnosis not present

## 2024-08-24 DIAGNOSIS — R0602 Shortness of breath: Secondary | ICD-10-CM | POA: Diagnosis not present

## 2024-08-24 DIAGNOSIS — Z7982 Long term (current) use of aspirin: Secondary | ICD-10-CM | POA: Insufficient documentation

## 2024-08-24 DIAGNOSIS — O99282 Endocrine, nutritional and metabolic diseases complicating pregnancy, second trimester: Secondary | ICD-10-CM | POA: Insufficient documentation

## 2024-08-24 DIAGNOSIS — R791 Abnormal coagulation profile: Secondary | ICD-10-CM | POA: Diagnosis not present

## 2024-08-24 DIAGNOSIS — Z3400 Encounter for supervision of normal first pregnancy, unspecified trimester: Secondary | ICD-10-CM

## 2024-08-24 DIAGNOSIS — R519 Headache, unspecified: Secondary | ICD-10-CM | POA: Diagnosis not present

## 2024-08-24 LAB — COMPREHENSIVE METABOLIC PANEL WITH GFR
ALT: 20 U/L (ref 0–44)
AST: 22 U/L (ref 15–41)
Albumin: 3.6 g/dL (ref 3.5–5.0)
Alkaline Phosphatase: 41 U/L (ref 38–126)
Anion gap: 11 (ref 5–15)
BUN: 8 mg/dL (ref 6–20)
CO2: 21 mmol/L — ABNORMAL LOW (ref 22–32)
Calcium: 9 mg/dL (ref 8.9–10.3)
Chloride: 104 mmol/L (ref 98–111)
Creatinine, Ser: 0.59 mg/dL (ref 0.44–1.00)
GFR, Estimated: >60 mL/min (ref >60)
Glucose, Bld: 111 mg/dL — ABNORMAL HIGH (ref 70–99)
Potassium: 3.4 mmol/L — ABNORMAL LOW (ref 3.5–5.1)
Sodium: 136 mmol/L (ref 135–145)
Total Bilirubin: 0.7 mg/dL (ref 0.0–1.2)
Total Protein: 6.9 g/dL (ref 6.5–8.1)

## 2024-08-24 LAB — CBC
HCT: 35.3 % — ABNORMAL LOW (ref 36.0–46.0)
Hemoglobin: 12.4 g/dL (ref 12.0–15.0)
MCH: 29.8 pg (ref 26.0–34.0)
MCHC: 35.1 g/dL (ref 30.0–36.0)
MCV: 84.9 fL (ref 80.0–100.0)
Platelets: 202 K/uL (ref 150–400)
RBC: 4.16 MIL/uL (ref 3.87–5.11)
RDW: 13 % (ref 11.5–15.5)
WBC: 9.9 K/uL (ref 4.0–10.5)
nRBC: 0 % (ref 0.0–0.2)

## 2024-08-24 LAB — URINALYSIS, ROUTINE W REFLEX MICROSCOPIC
Bilirubin Urine: NEGATIVE
Glucose, UA: NEGATIVE mg/dL
Hgb urine dipstick: NEGATIVE
Ketones, ur: NEGATIVE mg/dL
Leukocytes,Ua: NEGATIVE
Nitrite: NEGATIVE
Protein, ur: NEGATIVE mg/dL
Specific Gravity, Urine: 1.016 (ref 1.005–1.030)
pH: 5 (ref 5.0–8.0)

## 2024-08-24 LAB — POC URINE PREG, ED: Preg Test, Ur: POSITIVE — AB

## 2024-08-24 NOTE — ED Provider Notes (Signed)
 Tara Pierce Provider Note    Event Date/Time   First MD Initiated Contact with Patient 08/24/24 2314     (approximate)   History   Near Syncope, Back Pain, and Shortness of Breath   HPI  Tara Pierce is a 18 y.o. female [redacted] weeks pregnant presenting with near syncopal episode this afternoon.  States that she passed out on the couch yesterday.  Has been eating or drinking.  States that she felt lightheaded prior to passing out.  States that passing out episodes have happened in the past and typically associated with migraine headaches.  Also notes 2 years of left sided decrease sensation compared to the right.  States it has been constant and unchanged.  Has not been to see a neurologist for the symptoms.  States that she has a mild headache but not severe as her migraines typically.  No head strike or falls.  She denies any chest pain, has no intermittent shortness of breath for several weeks.  Also noted that her right lower extremity slightly larger than her left.  She denies any recent travel or surgeries, no history of blood clots.  Did see her OB/GYN who wanted her to follow-up with a cardiologist outpatient.  She denies any urinary symptoms, no nausea vomiting or diarrhea, no abdominal pain or back pain.  No fevers or neck stiffness.  No vision changes.  No focal weakness.  On independent chart review, she was seen by OB today for routine prenatal, is taking prenatal vitamins as well as aspirin  daily.  They report to them that she passed out but was unsure how long she was passed out for.  Did not hit any hard surfaces, states that incident occurred on the couch.  Reports feeling stressed out and not feeling hungry.     Physical Exam   Triage Vital Signs: ED Triage Vitals  Encounter Vitals Group     BP 08/24/24 2251 126/83     Girls Systolic BP Percentile --      Girls Diastolic BP Percentile --      Boys Systolic BP Percentile --      Boys  Diastolic BP Percentile --      Pulse Rate 08/24/24 2251 83     Resp 08/24/24 2251 20     Temp 08/24/24 2251 98.3 F (36.8 C)     Temp Source 08/24/24 2251 Oral     SpO2 08/24/24 2251 98 %     Weight 08/24/24 2251 210 lb (95.3 kg)     Height 08/24/24 2251 5' 2 (1.575 m)     Head Circumference --      Peak Flow --      Pain Score 08/24/24 2258 9     Pain Loc --      Pain Education --      Exclude from Growth Chart --     Most recent vital signs: Vitals:   08/25/24 0100 08/25/24 0130  BP: 124/81 123/72  Pulse: 81 84  Resp: (!) 24 17  Temp:    SpO2: 100% 99%     General: Awake, no distress.  CV:  Good peripheral perfusion.  Resp:  Normal effort.  No tachypnea or respiratory distress Abd:  No distention.  Soft nontender Other:  Pupils are equal reactive, extraocular movements are intact, no facial droop, no focal weakness, she does have decreased sensation to her left upper and lower face as well as to her left upper and  lower extremity.  Right lower extremity is just mildly larger than the left.  No mastoid tenderness or erythema.   ED Results / Procedures / Treatments   Labs (all labs ordered are listed, but only abnormal results are displayed) Labs Reviewed  COMPREHENSIVE METABOLIC PANEL WITH GFR - Abnormal; Notable for the following components:      Result Value   Potassium 3.4 (*)    CO2 21 (*)    Glucose, Bld 111 (*)    All other components within normal limits  CBC - Abnormal; Notable for the following components:   HCT 35.3 (*)    All other components within normal limits  URINALYSIS, ROUTINE W REFLEX MICROSCOPIC - Abnormal; Notable for the following components:   Color, Urine YELLOW (*)    APPearance HAZY (*)    All other components within normal limits  HCG, QUANTITATIVE, PREGNANCY - Abnormal; Notable for the following components:   hCG, Beta Chain, Quant, S 19,542 (*)    All other components within normal limits  D-DIMER, QUANTITATIVE - Abnormal;  Notable for the following components:   D-Dimer, Quant 1.24 (*)    All other components within normal limits  POC URINE PREG, ED - Abnormal; Notable for the following components:   Preg Test, Ur Positive (*)    All other components within normal limits  PROTIME-INR  MAGNESIUM  PHOSPHORUS  TROPONIN I (HIGH SENSITIVITY)     EKG  EKG shows, normal sinus rhythm with sinus arrhythmia, rate 86, normal QS, normal QTc, T wave inversion to 3, T wave flattening in V3, no obvious ischemic ST elevation, T wave inversion is new compared to prior   RADIOLOGY On my independent interpretation, CT PE without obvious PE   PROCEDURES:  Critical Care performed: No  Ultrasound ED Echo  Date/Time: 08/25/2024 12:41 AM  Performed by: Waymond Lorelle Cummins, MD Authorized by: Waymond Lorelle Cummins, MD   Procedure details:    Indications: dyspnea     Views: parasternal long axis view and parasternal short axis view   Findings:    Pericardium: no pericardial effusion     LV Function: normal (>50% EF)     RV Diameter: normal   Impression:    Impression: normal   Ultrasound ED Thoracic  Date/Time: 08/25/2024 12:41 AM  Performed by: Waymond Lorelle Cummins, MD Authorized by: Waymond Lorelle Cummins, MD   Procedure details:    Indications: dyspnea     Left lung pleural:  Visualized   Right lung pleural:  Visualized Findings:    A-lines noted throughout: identified     B-lines noted throughout: not identified   Right Lung Findings:     right lung sliding Left Lung Findings:     left lung sliding Impression:    Impression comment:  No B-lines noted bilaterally Ultrasound ED OB Pelvic  Date/Time: 08/25/2024 12:42 AM  Performed by: Waymond Lorelle Cummins, MD Authorized by: Waymond Lorelle Cummins, MD   Procedure details:    Indications: evaluate for IUP     Technique:  Transabdominal obstetric (HCG+) exam   Images: not archived    Uterine findings:    Intrauterine pregnancy: identified     Single gestation: identified     Fetal heart rate:  identified      Comments:     Fetal heart rate 149, positive fetal movement    MEDICATIONS ORDERED IN ED: Medications  sodium chloride  0.9 % bolus 1,000 mL (0 mLs Intravenous Stopped 08/25/24 0200)  iohexol  (OMNIPAQUE ) 350 MG/ML  injection 75 mL (75 mLs Intravenous Contrast Given 08/25/24 0244)     IMPRESSION / MDM / ASSESSMENT AND PLAN / ED COURSE  I reviewed the triage vital signs and the nursing notes.                              Differential diagnosis includes, but is not limited to, dehydration, electrolyte derangements, atypical ACS, anxiety, atypical ACS, did consider PE but she is not hypoxic, not tachypneic, she does have mild right lower extremity asymmetry.  For her decree sensation to the left side as well as her headache, given that she states that she has not received a workup in the past, we will get an MRI of her head as well as an MRV to make sure that there is no acute stroke or sinus venous thrombosis.  Will also get labs, EKG, troponin, IV fluids.  Did discuss extensively with her about CT angio if D-dimer was elevated, including the risk and benefits and she is agreeable with the plan proceed  Patient's presentation is most consistent with acute presentation with potential threat to life or bodily function.  Independent interpretation of labs and imaging below.  CT PE study and ultrasound without evidence of DVT, MRI without evidence of acute intracranial abnormalities or sinus venous thrombosis.  Considered but no indication for inpatient admission at this time, she safe for outpatient management.  Will give her a number to call for neurology to follow-up for her migraines as well as paresthesias, will also put in a referral for cardiology.  Instructed her to follow-up with her OB/GYN for further management of her pregnancy.  Will discharge with strict return precautions.  Shared decision making done with patient and she is agreeable with this plan.  The patient is on the  cardiac monitor to evaluate for evidence of arrhythmia and/or significant heart rate changes.   Clinical Course as of 08/25/24 0309  Wed Aug 25, 2024  0105 Independent review of labs, UA is not consistent with UTI, electrolytes not severely deranged, LFTs are normal, D-dimer is elevated, mag and troponin are not elevated, Phos is normal.  No leukocytosis.  Given elevated D-dimer, will proceed with CT PE study. [TT]  0132 MR BRAIN WO CONTRAST IMPRESSION: 1. Normal brain MRI. No acute intracranial abnormality. 2. Normal intracranial MRV. No evidence for dural venous sinus thrombosis. 3. Moderate paranasal sinus disease with superimposed air-fluid level and pneumatized secretions within the right maxillary sinus. Clinical correlation for possible acute sinusitis recommended. 4. Left mastoid effusion, of uncertain significance. Correlation with physical exam recommended   [TT]  0246 US  Venous Img Lower Bilateral (DVT) 1. No evidence of DVT.  [TT]  0305 CT Angio Chest PE W/Cm &/Or Wo Cm IMPRESSION: 1. No evidence of pulmonary embolism. 2. Negative CT chest.   [TT]    Clinical Course User Index [TT] Waymond Lorelle Cummins, MD     FINAL CLINICAL IMPRESSION(S) / ED DIAGNOSES   Final diagnoses:  Near syncope  Paresthesias  Pregnancy, unspecified gestational age  Shortness of breath     Rx / DC Orders   ED Discharge Orders          Ordered    Ambulatory referral to Cardiology       Comments: If you have not heard from the Cardiology office within the next 72 hours please call [phone].   08/25/24 9692  Note:  This document was prepared using Dragon voice recognition software and may include unintentional dictation errors.    Waymond Lorelle Cummins, MD 08/25/24 [phone]

## 2024-08-24 NOTE — Patient Instructions (Signed)
 Pregnancy Continue taking your prenatal vitamin daily and aspirin  81 mg daily.  Please schedule your next prenatal visit for about 4 weeks  from now.  Please visit WIC (women's, infants, and children program) to see about their breast feeding peer support program. You can start this program to get tips and tricks for successful breast feeding of your baby.   Prenatal Classes If delivering at [facility] with [facility] or [facility]: Go to [email or web address]   If delivering at [facility]: Go to [email or web address] and search for: "Pregnancy and Parenting Classes" "Prepared Childbirth Classes"  Resource regarding exposures that could affect pregnancy: Mother To Natale [employer] is a Dentist with lots of information on the effects of many medications and exposures on your pregnancy. It is free to use, including their web site, phone line, text service, app, or email and live chat.  [email or web address]/ Email or live chat: MotherToBaby.org Phone: [phone] Text: [phone] App: search "L[employer]"   Maternal Mental Health If you start to develop the below symptoms of depression, please reach out to us  for an appointment. There is also a Biomedical scientist Health Hotline at [phone]). This hotline has trained counselors, doulas, and midwifes to real-time support, information, and resources.  Feeling sad or hopeless most of the time Lack of interest in things you used to enjoy Less interest in caring for yourself (dressing, fixing hair) Trouble concentrating Trouble coping with daily tasks Constant worry about your baby Sleeping or eating too much or too little Feeling very anxious or nervous Unexplained irritability or anger Unwanted or scary thoughts Feeling that you are not a good mother Thoughts of hurting yourself or your baby  If you feel you are experiencing a  mental health crisis, please reach out to the National Suicide Prevention Hotline at 1-800-273-TALK 312-582-7491).

## 2024-08-24 NOTE — ED Notes (Signed)
 Green and blue top tubes collected and sent to lab

## 2024-08-24 NOTE — Progress Notes (Addendum)
 Here today for 17.2 week MH RV. Taking PNV and ASA every day. Denies ED/hospital visits since last RV. Complains of feeling stressed out and does not feel hungry reports I passed out one time yesterday unsure of how long she was unconscious but reports she did just sat down on the couch and did not hit the floor or any hard surface or sustain any known injuries. Declines Flu vaccine accepts AFP today. BP repeated after sitting for 5 minutes. Niels Devonshire, RN

## 2024-08-24 NOTE — ED Triage Notes (Addendum)
 Pt arrives POV ambulatory to triage w/ no acute distress noted. Pt reports having an episode of near syncope this afternoon w/ sob and lower back pain. Pt reports passing out yesterday but was seen by OB earlier today.  Pt reports being [redacted]w[redacted]d, denies vaginal d/c. Pt reports HA w/ position changes and increase in sob over the past month.

## 2024-08-24 NOTE — ED Notes (Signed)
Urine sample collected at this time.

## 2024-08-25 ENCOUNTER — Emergency Department

## 2024-08-25 ENCOUNTER — Telehealth: Payer: Self-pay | Admitting: Family Medicine

## 2024-08-25 LAB — HCG, QUANTITATIVE, PREGNANCY: hCG, Beta Chain, Quant, S: 19542 m[IU]/mL — ABNORMAL HIGH (ref <5)

## 2024-08-25 LAB — PROTIME-INR
INR: 1 (ref 0.8–1.2)
Prothrombin Time: 13.4 s (ref 11.4–15.2)

## 2024-08-25 LAB — MAGNESIUM: Magnesium: 1.9 mg/dL (ref 1.7–2.4)

## 2024-08-25 LAB — TROPONIN I (HIGH SENSITIVITY): Troponin I (High Sensitivity): <2 ng/L (ref <18)

## 2024-08-25 LAB — PHOSPHORUS: Phosphorus: 4.6 mg/dL (ref 2.5–4.6)

## 2024-08-25 LAB — D-DIMER, QUANTITATIVE: D-Dimer, Quant: 1.24 ug{FEU}/mL — ABNORMAL HIGH (ref 0.00–0.50)

## 2024-08-25 MED ORDER — SODIUM CHLORIDE 0.9 % IV BOLUS
1000.0000 mL | Freq: Once | INTRAVENOUS | Status: AC
  Administered 2024-08-25: 1000 mL via INTRAVENOUS

## 2024-08-25 MED ORDER — IOHEXOL 350 MG/ML SOLN
75.0000 mL | Freq: Once | INTRAVENOUS | Status: AC | PRN
  Administered 2024-08-25: 75 mL via INTRAVENOUS

## 2024-08-25 NOTE — Discharge Instructions (Addendum)
 Please make sure to follow-up with urology for further management of your migraines as well as your sensory deficits to be ongoing for the last 2 years.  I put in a referral for you for cardiology to follow-up for your lightheadedness and near syncopal episodes.  Please be sure to follow-up with your OB/GYN for further management of your pregnancy.  Please make sure to keep her self hydrated.

## 2024-08-26 ENCOUNTER — Ambulatory Visit

## 2024-08-26 VITALS — BP 126/81 | HR 90 | Wt 212.6 lb

## 2024-08-26 DIAGNOSIS — R55 Syncope and collapse: Secondary | ICD-10-CM

## 2024-08-26 DIAGNOSIS — Z3400 Encounter for supervision of normal first pregnancy, unspecified trimester: Secondary | ICD-10-CM | POA: Insufficient documentation

## 2024-08-26 DIAGNOSIS — Z3A17 17 weeks gestation of pregnancy: Secondary | ICD-10-CM

## 2024-08-26 DIAGNOSIS — Z3402 Encounter for supervision of normal first pregnancy, second trimester: Secondary | ICD-10-CM

## 2024-08-26 DIAGNOSIS — R202 Paresthesia of skin: Secondary | ICD-10-CM | POA: Insufficient documentation

## 2024-08-26 DIAGNOSIS — O99212 Obesity complicating pregnancy, second trimester: Secondary | ICD-10-CM

## 2024-08-26 NOTE — Progress Notes (Signed)
 [facility] [address] [place] [address] Main phone: [phone]  Prenatal Visit  Subjective:  Tara Pierce is a 18 y.o. G1P0000 at [redacted]w[redacted]d being seen today for ongoing prenatal care.  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  Contractions: Not present. Vag. Bleeding: None.  Movement: Present. Denies leaking of fluid/ROM.   Syncopal episode - last Saturday  - lots of increased life stressors, including conflict with FOB - sxs: feeling hot, dizzy, sweaty - reported was standing and cooking food, experienced the symptoms, and passed out once she got to the couch - unsure how long she was unconscious (she was caring for small children at the time who did not notice) - has experienced a handful of these episodes prior to pregnancy, never before worked up - no known personal or family history of cardiac issues including arrhythmias or structural defects  The following portions of the patient's history were reviewed and updated as appropriate: allergies, current medications, past family history, past medical history, past social history, past surgical history and problem list. Problem list updated.  Objective:   Vitals:   08/26/24 1257  BP: 126/81  Pulse: 90  Weight: 212 lb 9.6 oz (96.4 kg)   Fetal Status: Fetal Heart Rate (bpm): 152 Fundal Height: 18 cm Movement: Present     General:  Alert, oriented and cooperative. Patient is in no acute distress.  Skin: Skin is  warm and dry. No rash noted.   Cardiovascular: RRR, no m/g/r/c, brisk cap refill  Respiratory: Normal respiratory effort, CTAB all fields anteriorly and posteriorly  Abdomen: Soft, gravid, appropriate for gestational age.  Pain/Pressure: Absent     Pelvic: Cervical exam deferred        Extremities: Normal range of motion.     Mental Status: Intermittently tearful when discussing life stressors. Normal behavior. Normal judgment and thought content.   Assessment and Plan:  Pregnancy: G1P0000 at [redacted]w[redacted]d  [redacted] weeks gestation of pregnancy  Supervision of normal first pregnancy, antepartum Assessment & Plan: Decreased appetite and syncopal episodes. See othe A&P for more. BP normal. TWG 12 lb 9.6 oz (5.715 kg). Taking prenatal vitamin daily and aspirin  81 mg daily. Politely declines flu vaccine today. Anatomy US  scheduled for 10/10. Next prenatal appointment in 4 weeks .    Orders: -     Ambulatory referral to Behavioral Health  Syncope, unspecified syncope type Assessment & Plan: Acute, episodic. Tara Pierce reports syncopal episode last Saturday. Concerning symptoms include feeling of dizziness. Differential is broad, but includes vasovagal syncope, orthostasis, arrhythmia, hypoglycemia, and anxiety. No known personal or family history of cardiac defects, arrhythmia, diabetes, insulinoma, or vascular issue. Patient not taking any medications that would affect BP or pulse. Physical exam reassuring. Likely vasovagal or anxiety-related, but will proceed with full work up.  - therapy referral - cardiology referral - cardiac monitor x 14 days (for start of evaluation before she gets in to cardiology)  Orders: -     LONG TERM MONITOR (3-14 DAYS); Future -     Ambulatory referral to  Behavioral Health  Obesity affecting pregnancy, antepartum, unspecified obesity type Assessment & Plan: Tolerating aspirin  daily. Nutrition referral already placed.    Teen pregnancy -     Ambulatory referral to  Behavioral Health  Centering Pregnancy, Session#6: Reviewed resources in [employer].   Facilitated discussion today:  Care of mother immediately after delivery, skin to skin for breastfeeding initiation, safe sleep Mindfulness activity with visualization of the first hour after birth as well as relaxation breathing with counter-pressure  Fundal height and FHR appropriate today unless noted otherwise in plan. Patient to continue group care.    Preterm labor symptoms and general obstetric precautions including but not limited to vaginal bleeding, contractions, leaking of fluid and fetal movement were reviewed in detail with the patient. Please refer to After Visit Summary for other counseling recommendations.  No follow-ups on file.  Future Appointments  Date Time Provider Department Center  09/10/2024  1:00 PM [facility]  3 [facility]  10/07/2024 11:30 AM [facility]  1 [facility] None  10/12/2024  8:50 AM Furth, Cadence H, PA-C [facility] None    Betsey CHRISTELLA Helling, MD

## 2024-08-26 NOTE — Progress Notes (Signed)
 Declined Flu vaccine. C/o feeling faint/lightheaded with sweaty palms  and is feeling that bow. Last meal breakfast; snack given. Requests breast pump. BTHIELE RN

## 2024-08-28 LAB — AFP, SERUM, OPEN SPINA BIFIDA
AFP MoM: 1.33
AFP Value: 42.1 ng/mL
Gest. Age on Collection Date: 17.2 wk
Maternal Age At EDD: 20.3 a
OSBR Risk 1 IN: 4399
Test Results:: NEGATIVE
Weight: 210 [lb_av]

## 2024-08-30 ENCOUNTER — Ambulatory Visit: Attending: Medical | Admitting: Medical

## 2024-08-30 ENCOUNTER — Ambulatory Visit

## 2024-08-30 ENCOUNTER — Other Ambulatory Visit: Payer: Self-pay

## 2024-08-30 VITALS — BP 119/75 | HR 82 | Ht 62.0 in | Wt 212.4 lb

## 2024-08-30 DIAGNOSIS — R079 Chest pain, unspecified: Secondary | ICD-10-CM | POA: Insufficient documentation

## 2024-08-30 DIAGNOSIS — R55 Syncope and collapse: Secondary | ICD-10-CM | POA: Diagnosis not present

## 2024-08-30 NOTE — Progress Notes (Signed)
 Cardiology Office Note   Date:  08/30/2024  ID:  Tara Pierce, DOB 13-Apr-2006, MRN [id number] PCP: Department, [facility] Providers Cardiologist:  None   History of Present Illness Tara Pierce is a 18 y.o. female with a h/o migraines who is at [redacted] weeks gestation who presents as a new patient for syncope and chest pain.  Mother has a heart murmur. Father has HTN. The patient denies alcohol, tobacco use or drug.   The patient was seen in the ER 08/24/24 for near syncope, back pain, and SOB. She was [redacted] weeks pregnant. She passed out on the cough. Vitals were stable. Ddimer 1.24. Chest CTA, DVT US , MR head unremarkable. HS trop negative x 2. She was referred to cardiology.   Today, she is [redacted] weeks pregnant. The day before the ER visit the patient did pass out. She reports years of passing out. It seems it was worse when she was walking or standing. She was told it was from migraines. She has a h/o migraines and have full body numbness. She saw neurology and she said they didn't find anything. She has not had migraines in about a year, only headaches.   Since her pregnancy, she has passed out once. she was cooking when she started feeling lightheaded and dizzy. She sat down and felt lightheaded and she went black. She woke up and things felt distorted. She feels heart racing, chest pinching, shortness of breath, dizziness and lightheaded prior to fainting. She reports good hydration and eating during pregnancy. She has chest pinching when she laughs. She has SOB when she walks. This is for a couple minutes. No lower leg edema.    Studies Reviewed EKG Interpretation Date/Time:  Monday August 30 2024 13:53:12 EDT Ventricular Rate:  82 PR Interval:  144 QRS Duration:  78 QT Interval:  346 QTC Calculation: 404 R Axis:   31  Text Interpretation: Normal sinus rhythm with sinus arrhythmia Normal ECG When compared with ECG of 24-Aug-2024  22:59, Minimal criteria for Inferior infarct are no longer Present Confirmed by Franchester, Norah Fick ([id number]) on 08/30/2024 2:12:38 PM        Physical Exam VS:  BP 119/75 (BP Location: Right Arm, Patient Position: Sitting, Cuff Size: Normal)   Pulse 82   Ht 5' 2 (1.575 m)   Wt 212 lb 6.4 oz (96.3 kg)   LMP 02/14/2024 (Within Weeks) Comment: patient unsure when she had LMP/taking ocp intermittently/had negative UPT on 04/12/24  SpO2 98%   BMI 38.85 kg/m   Orthostatic VS for the past 24 hrs (Last 3 readings):  BP- Lying Pulse- Lying BP- Sitting Pulse- Sitting BP- Standing at 0 minutes Pulse- Standing at 0 minutes BP- Standing at 3 minutes Pulse- Standing at 3 minutes  08/30/24 1358 126/74 75 115/75 83 118/76 113 115/83 109      Wt Readings from Last 3 Encounters:  08/30/24 212 lb 6.4 oz (96.3 kg) (98%, Z= 2.12)*  08/26/24 212 lb 9.6 oz (96.4 kg) (98%, Z= 2.12)*  08/24/24 210 lb (95.3 kg) (98%, Z= 2.08)*   * Growth percentiles are based on CDC (Girls, 2-20 Years) data.    GEN: Well nourished, well developed in no acute distress NECK: No JVD; No carotid bruits CARDIAC: RRR, no murmurs, rubs, gallops RESPIRATORY:  Clear to auscultation without rales, wheezing or rhonchi  ABDOMEN: Soft, non-tender, non-distended EXTREMITIES:  No edema; No deformity   ASSESSMENT AND PLAN  Syncope Patient reports years of syncope,  it would occur about once a month.  She was told that this was from migraines.  Patient is [redacted] weeks pregnant and had a syncopal episode last week. She was cooking and felt like she was going to faint and sat down and lost consciousness. She felt dizzy and lightheaded prior to passing out.  She went to the ER workup was unremarkable. Prior head imaging was unremarkable. Orthostatics today showed normal blood pressure response, but heart rate increases 30 points from sitting to standing.  May have ?POTS.  I will check a 2-week heart monitor and echocardiogram.  Recent labs were stable.  I  discussed good hydration/electrolytes, and salt intake if symptoms occur.  Chest discomfort Patient reports chest pinches at random times.  Chest CTA showed no coronary artery calcifications.  Very low suspicion of cardiac origin.  Echo as above.  We will reevaluate after pregnancy.       Dispo: Follow-up in 4 to 6 weeks  Signed, Anup Brigham VEAR Fishman, PA-C

## 2024-08-30 NOTE — Patient Instructions (Addendum)
 Medication Instructions:  Your physician recommends that you continue on your current medications as directed. Please refer to the Current Medication list given to you today.    *If you need a refill on your cardiac medications before your next appointment, please call your pharmacy*  Lab Work: No labs ordered today    Testing/Procedures: Your physician has requested that you have an echocardiogram. Echocardiography is a painless test that uses sound waves to create images of your heart. It provides your doctor with information about the size and shape of your heart and how well your heart's chambers and valves are working.   You may receive an ultrasound enhancing agent through an IV if needed to better visualize your heart during the echo. This procedure takes approximately one hour.  There are no restrictions for this procedure.  This will take place at [address] [facility]) [address], [place] [address]  Please note: We ask at that you not bring children with you during ultrasound (echo/ vascular) testing. Due to room size and safety concerns, children are not allowed in the ultrasound rooms during exams. Our front office staff cannot provide observation of children in our lobby area while testing is being conducted. An adult accompanying a patient to their appointment will only be allowed in the ultrasound room at the discretion of the ultrasound technician under special circumstances. We apologize for any inconvenience.   ZIO XT- Long Term Monitor Instructions  Your physician has requested you wear a ZIO patch monitor for 14 days.  This is a single patch monitor. [employer] supplies one patch monitor per enrollment. Additional stickers are not available. Please do not apply patch if you will be having a Nuclear Stress Test, Echocardiogram, Cardiac CT, MRI, or Chest Xray during the period you would be wearing the monitor. The patch cannot be worn during these tests. You cannot  remove and re-apply the ZIO XT patch monitor.  Your ZIO patch monitor will be mailed 3 day USPS to your address on file. It may take 3-5 days to receive your monitor after you have been enrolled. Once you have received your monitor, please review the enclosed instructions. Your monitor has already been registered assigning a specific monitor serial number to you.  Billing and Patient Assistance Program Information  We have supplied [employer] with any of your insurance information on file for billing purposes.  [employer] offers a sliding scale Patient Assistance Program for patients that do not have insurance, or whose insurance does not completely cover the cost of the ZIO monitor.  You must apply for the Patient Assistance Program to qualify for this discounted rate.  To apply, please call [employer] at [phone], select option 4, select option 2, ask to apply for Patient Assistance Program. Meredeth will ask your household income, and how many people are in your household. They will quote your out-of-pocket cost based on that information. [employer] will also be able to set up a 10-month, interest-free payment plan if needed.  Applying the monitor   Shave hair from upper left chest.  Hold abrader disc by orange tab. Rub abrader in 40 strokes over the upper left chest as indicated in your monitor instructions.  Clean area with 4 enclosed alcohol pads. Let dry.  Apply patch as indicated in monitor instructions. Patch will be placed under collarbone on left side of chest with arrow pointing upward.  Rub patch adhesive wings for 2 minutes. Remove white label marked 1. Remove the white label marked 2.  Rub patch adhesive wings for 2 additional minutes.  While looking in a mirror, press and release button in center of patch. A small green light will flash 3-4 times. This will be your only indicator that the monitor has been turned on.  Do not shower for the first 24 hours. You may shower after the first 24  hours.  Press the button if you feel a symptom. You will hear a small click. Record Date, Time and Symptom in the Patient Logbook.  When you are ready to remove the patch, follow instructions on the last 2 pages of Patient Logbook.  Stick patch monitor into the tabs at the bottom of the return box.  Place Patient Logbook in the blue and white box. Use locking tab on box and tape box closed securely. The blue and white box has prepaid postage on it. Please place it in the mailbox as soon as possible. Your physician should have your test results approximately 7-14 days after the monitor has been mailed back to [facility].  Call [facility] Customer Care at [phone] if you have questions regarding your ZIO XT patch monitor.  Call them immediately if you see an orange light blinking on your monitor.  If your monitor falls off in less than 4 days, contact our Monitor department at [phone].  If your monitor becomes loose or falls off after 4 days call [employer] at [phone] for suggestions on securing your monitor.   Follow-Up: At [facility], you and your health needs are our priority.  As part of our continuing mission to provide you with exceptional heart care, our providers are all part of one team.  This team includes your primary Cardiologist (physician) and Advanced Practice Providers or APPs (Physician Assistants and Nurse Practitioners) who all work together to provide you with the care you need, when you need it.  Your next appointment:   4 -6 week(s)  Provider:   Mikey Fishman, PA-C

## 2024-08-31 NOTE — Assessment & Plan Note (Addendum)
 Decreased appetite and syncopal episodes. See othe A&P for more. BP normal. TWG 12 lb 9.6 oz (5.715 kg). Taking prenatal vitamin daily and aspirin  81 mg daily. Politely declines flu vaccine today. Anatomy US  scheduled for 10/10. Next prenatal appointment in 4 weeks .

## 2024-08-31 NOTE — Assessment & Plan Note (Signed)
 Acute, episodic. Britanni reports syncopal episode last Saturday. Concerning symptoms include feeling of dizziness. Differential is broad, but includes vasovagal syncope, orthostasis, arrhythmia, hypoglycemia, and anxiety. No known personal or family history of cardiac defects, arrhythmia, diabetes, insulinoma, or vascular issue. Patient not taking any medications that would affect BP or pulse. Physical exam reassuring. Likely vasovagal or anxiety-related, but will proceed with full work up.  - therapy referral - cardiology referral - cardiac monitor x 14 days (for start of evaluation before she gets in to cardiology)

## 2024-08-31 NOTE — Assessment & Plan Note (Signed)
 Tolerating aspirin  daily. Nutrition referral already placed.

## 2024-09-01 ENCOUNTER — Ambulatory Visit: Payer: Self-pay | Admitting: Family Medicine

## 2024-09-01 DIAGNOSIS — Z3400 Encounter for supervision of normal first pregnancy, unspecified trimester: Secondary | ICD-10-CM

## 2024-09-01 NOTE — Progress Notes (Signed)
 Negative AFP screen.   Tara Helling, MD 09/01/24  12:15 PM

## 2024-09-01 NOTE — Assessment & Plan Note (Signed)
 Discussed syncopal episode and differential. Lots of stress right now with FOB. Physical exam reassuring, no known personal or family history of cardiac issues. Patient amenable to gradual work up. Discussed therapy referral and Zio patch; patient amenable to both. ED precautions given.

## 2024-09-01 NOTE — Progress Notes (Signed)
 [facility] [address] [place] [address] Main phone: [phone]  Prenatal Visit  Subjective:  Tara Pierce is a 18 y.o. G1P0000 at [redacted]w[redacted]d being seen today for ongoing prenatal care.  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  Contractions: Not present. Vag. Bleeding: None.  Movement: Present. Denies leaking of fluid/ROM.   Syncopal episode - last Saturday  - lots of increased life stressors, including conflict with FOB - sxs: feeling hot, dizzy, sweaty - reported was standing and cooking food, experienced the symptoms, and passed out once she got to the couch - unsure how long she was unconscious (she was caring for small children at the time who did not notice) - has experienced a handful of these episodes prior to pregnancy, never before worked up - no known personal or family history of cardiac issues including arrhythmias or structural defects  The following portions of the patient's history were reviewed and updated as appropriate: allergies, current medications, past family history, past medical history, past social history, past surgical history and problem list. Problem list updated.  Objective:   Vitals:   08/24/24 1038 08/24/24 1048  BP: 113/84 115/87  Pulse: 92 84  Temp: 98 F (36.7 C)   Weight: 210 lb 3.2 oz (95.3 kg)    Fetal Status: Fetal Heart Rate (bpm): 146 Fundal Height: 18 cm Movement: Present     General:  Alert, oriented and cooperative. Patient is in no acute  distress.  Skin: Skin is warm and dry. No rash noted.   Cardiovascular: Normal heart rate noted  Respiratory: Normal respiratory effort, no problems with respiration noted  Abdomen: Soft, gravid, appropriate for gestational age.  Pain/Pressure: Absent     Pelvic: Cervical exam deferred        Extremities: Normal range of motion.     Mental Status: Normal mood and affect. Normal behavior. Normal judgment and thought content.   Assessment and Plan:  Pregnancy: G1P0000 at [redacted]w[redacted]d  [redacted] weeks gestation of pregnancy  Supervision of normal first pregnancy, antepartum Assessment & Plan: BP normal. TWG 10 lb 3.2 oz (4.627 kg). Taking prenatal vitamin daily and aspirin  81 mg daily. Next prenatal appointment in 4 weeks .    Orders: -     AFP, Serum, Open Spina Bifida  Syncope, unspecified syncope type Assessment & Plan: Discussed syncopal episode and differential. Lots of stress right now with FOB. Physical exam reassuring, no known personal or family history of cardiac issues. Patient amenable to gradual work up. Discussed therapy referral and Zio patch; patient amenable to both. ED precautions given.   Orders: -     Ambulatory referral to Behavioral Health  Anxious mood -     Ambulatory referral to Behavioral Health   Preterm labor symptoms and general obstetric precautions including but not limited to vaginal bleeding, contractions, leaking of fluid and fetal movement were reviewed in detail with the patient. Please refer to After Visit Summary for other counseling recommendations.  No follow-ups on file.  Future Appointments  Date Time Provider Department Center  09/10/2024  1:00 PM [facility]  3 [facility]  [facility]  10/07/2024 11:30 AM [facility] US  1 [facility] None  10/12/2024  8:50 AM Furth, Cadence H, PA-C [facility] None   Betsey CHRISTELLA Helling, MD

## 2024-09-01 NOTE — Assessment & Plan Note (Signed)
 BP normal. TWG 10 lb 3.2 oz (4.627 kg). Taking prenatal vitamin daily and aspirin  81 mg daily. Next prenatal appointment in 4 weeks .

## 2024-09-10 ENCOUNTER — Ambulatory Visit
Admission: RE | Admit: 2024-09-10 | Discharge: 2024-09-10 | Disposition: A | Discharge status: Home / Self Care | Payer: Self-pay | Source: Ambulatory Visit | Attending: Family Medicine | Admitting: Family Medicine

## 2024-09-10 DIAGNOSIS — Z3400 Encounter for supervision of normal first pregnancy, unspecified trimester: Secondary | ICD-10-CM | POA: Diagnosis present

## 2024-09-10 DIAGNOSIS — O321XX0 Maternal care for breech presentation, not applicable or unspecified: Secondary | ICD-10-CM | POA: Insufficient documentation

## 2024-09-10 DIAGNOSIS — Z3A19 19 weeks gestation of pregnancy: Secondary | ICD-10-CM | POA: Insufficient documentation

## 2024-09-10 DIAGNOSIS — Z3689 Encounter for other specified antenatal screening: Secondary | ICD-10-CM | POA: Diagnosis not present

## 2024-09-10 DIAGNOSIS — Z363 Encounter for antenatal screening for malformations: Secondary | ICD-10-CM | POA: Diagnosis not present

## 2024-09-16 ENCOUNTER — Telehealth: Payer: Self-pay

## 2024-09-16 NOTE — Telephone Encounter (Signed)
 Call to client at 1830 09/16/24 to 1) ascertain status of [facility]rology appt (Per Dr. Macario referral, Neuro called client and left message for her to call regarding appt. Per [facility], client has 12/14/24 Neuro Clinic appt at [facility] and Dr. Macario stated client could keep this appt) and 2) notify her of 10/11/24 follow-up anatomy US  appt with arrival time of 1245 with a full bladder. Left US  appt information on voicemail and number to call [facility] also provided. Burnadette Lowers, RN

## 2024-09-16 NOTE — Progress Notes (Signed)
 Reviewed anatomy US . EFW 71%. EGA consistent with early US . Need follow up for LVOT. Will order today. Otherwise normal anatomy.   Dorothyann Helling, MD 09/16/24  10:07 AM

## 2024-09-16 NOTE — Telephone Encounter (Signed)
 Call to client to 1) ascertain status of [facility] Neurology appt (per 09/15/24 fax, scheduler left message on voicemail to call regarding appt and 2) notify her of 10/11/24 follow-up anatomy US  appt at the [facility] with arrival time of 1245. Client to drink 1 quart of water by 1230 and not empty bladder). Left message to call regarding above and did leave US  appt / location in voicemail message. [facility] number to call provided. Burnadette Lowers, RN

## 2024-09-16 NOTE — Telephone Encounter (Signed)
 Fax received yesterday in response to [facility] referral to [facility] Neurology. Per fax, client is established client and scheduler left her a message regarding appt. This RN called client this am to ascertain message received and provide her Neurology Clinic appt phone number if needed. Left message to call regarding above and number provided. Burnadette Lowers, RN

## 2024-09-16 NOTE — Addendum Note (Signed)
 Addended by: Georgie Eduardo M on: 09/16/2024 10:11 AM   Modules accepted: Orders

## 2024-09-17 ENCOUNTER — Telehealth: Payer: Self-pay

## 2024-09-17 NOTE — Telephone Encounter (Signed)
 Please see phone encounter dated 09/17/24. Burnadette Lowers, RN

## 2024-09-17 NOTE — Telephone Encounter (Signed)
 Call to client who states has 12/14/2024 appt with [facility] at 0945. Per client, this is first available appt. Client counseled on 10/11/24 US  appt with arrival time of 1245. Counseled to drink 1 quart of water by 1230 and not empty bladder. Burnadette Lowers, RN

## 2024-09-23 ENCOUNTER — Ambulatory Visit: Admitting: Family Medicine

## 2024-09-23 VITALS — BP 124/85 | HR 107 | Wt 216.4 lb

## 2024-09-23 DIAGNOSIS — O99212 Obesity complicating pregnancy, second trimester: Secondary | ICD-10-CM

## 2024-09-23 DIAGNOSIS — Z3A21 21 weeks gestation of pregnancy: Secondary | ICD-10-CM

## 2024-09-23 DIAGNOSIS — R55 Syncope and collapse: Secondary | ICD-10-CM

## 2024-09-23 DIAGNOSIS — O99210 Obesity complicating pregnancy, unspecified trimester: Secondary | ICD-10-CM

## 2024-09-23 DIAGNOSIS — Z91013 Allergy to seafood: Secondary | ICD-10-CM | POA: Insufficient documentation

## 2024-09-23 DIAGNOSIS — Z3400 Encounter for supervision of normal first pregnancy, unspecified trimester: Secondary | ICD-10-CM

## 2024-09-23 DIAGNOSIS — Z3402 Encounter for supervision of normal first pregnancy, second trimester: Secondary | ICD-10-CM

## 2024-09-23 MED ORDER — EPINEPHRINE 0.3 MG/0.3ML IJ SOAJ: 0.3000 mg | INTRAMUSCULAR | 3 refills | Status: AC | PRN

## 2024-09-23 NOTE — Assessment & Plan Note (Signed)
 Doing well. BP normal. TWG 16 lb 6.4 oz (7.439 kg). Taking prenatal vitamin daily and aspirin  81 mg daily. Anatomy 10/10 normal but incomplete; f/u on 11/10. Next prenatal appointment in 4 weeks .

## 2024-09-23 NOTE — Assessment & Plan Note (Signed)
 S/p heart monitor x 2 weeks, does not yet have results. Intermittent palpitations and light-headedness continue without any syncope. No recent ED visits since last prenatal visit. ED precautions reviewed. Upcoming appts:  11/06 ECHO 11/11 cardiology appt with [facility]

## 2024-09-23 NOTE — Assessment & Plan Note (Signed)
 Tolerating aspirin  81 mg daily. TWG 16 lb 6.4 oz (7.439 kg) thus far. Anatomy US  11/10.

## 2024-09-23 NOTE — Progress Notes (Signed)
 [facility] [address], [address] [place] [address] Main phone: [phone]  Prenatal Visit  Subjective:  Tara Pierce is a 18 y.o. G1P0000 at [redacted]w[redacted]d being seen today for ongoing prenatal care.  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  Contractions: Not present. Vag. Bleeding: None.  Movement: Present. Denies leaking of fluid/ROM.   The following portions of the patient's history were reviewed and updated as appropriate: allergies, current medications, past family history, past medical history, past social history, past surgical history and problem list. Problem list updated.  Objective:   Vitals:   09/23/24 1254  BP: 124/85  Pulse: (!) 107  Weight: 216 lb 6.4 oz (98.2 kg)   Fetal Status: Fetal Heart Rate (bpm): 150 Fundal Height: 23 cm Movement: Present     General:  Alert, oriented and cooperative. Patient is in no acute distress.  Skin: Skin is warm and dry. No rash noted.   Cardiovascular: Normal heart rate noted  Respiratory: Normal respiratory effort, no problems with respiration noted  Abdomen: Soft, gravid, appropriate for gestational age.  Pain/Pressure: Absent     Pelvic: Cervical exam performed        Extremities: Normal range of motion.     Mental Status: Normal mood and affect. Normal behavior. Normal judgment and thought content.   Assessment and Plan:  Pregnancy: G1P0000 at [redacted]w[redacted]d  [redacted] weeks gestation of pregnancy  Supervision of normal first pregnancy,  antepartum Assessment & Plan: Doing well. BP normal. TWG 16 lb 6.4 oz (7.439 kg). Taking prenatal vitamin daily and aspirin  81 mg daily. Anatomy 10/10 normal but incomplete; f/u on 11/10. Next prenatal appointment in 4 weeks .     Obesity affecting pregnancy, antepartum, unspecified obesity type Assessment & Plan: Tolerating aspirin  81 mg daily. TWG 16 lb 6.4 oz (7.439 kg) thus far. Anatomy US  11/10.    Shrimp allergy Assessment & Plan: Subacute, reports allergic reaction to shrimp 1-2 weeks ago in which her lips and tongue swelled and her throat felt itchy. Took benadryl  and symptoms resolved eventually. Lip swelling took 5 or so days to resolve. Counseled her on ED precautions. Prescribed EpiPen, described use. Directed her to online training videos form manufacturer for future reference. Paper referral form completed for [facility], given to RN to fax.   Orders: -     EPINEPHrine; Inject 0.3 mg into the muscle as needed for anaphylaxis.  Dispense: 2 each; Refill: 3  Syncope, unspecified syncope type Assessment & Plan: S/p heart monitor x 2 weeks, does not yet have results. Intermittent palpitations and light-headedness continue without any syncope. No recent ED visits since last prenatal visit. ED precautions reviewed. Upcoming appts:  11/06 ECHO 11/11 cardiology appt with [facility]    Centering Pregnancy, Session#3: Reviewed resources in [employer].   Facilitated discussion today:  Stress/Stress reduction and breastfeeding Mindfulness activity completed as well as deep breathing with still touch for childbirth preparation.    Fundal height and FHR appropriate today unless noted otherwise in plan. Patient to continue group care.  Preterm labor symptoms and general obstetric precautions including but not limited to vaginal bleeding, contractions, leaking of fluid and fetal movement were reviewed in detail with the patient. Please refer to After Visit Summary for  other counseling recommendations.  Return in about 4 weeks (around 10/21/2024) for next routine prenatal visit.  Future Appointments  Date Time Provider Department Center  10/07/2024 11:30 AM [facility] US  1 [facility] None  10/11/2024  1:00 PM [facility]  3 [facility]  [facility]  10/12/2024  8:50 AM Furth, Cadence H, PA-C [facility] None  10/21/2024  1:20 PM [facility] PROVIDER [facility] None  11/04/2024  1:20 PM [facility] PROVIDER [facility] None  11/18/2024  1:20 PM [facility] PROVIDER [facility] None  12/03/2024 12:30 PM [facility] PROVIDER [facility] None  12/16/2024 12:30 PM [facility] PROVIDER [facility] None  12/30/2024 12:30 PM [facility] PROVIDER [facility] None  01/13/2025 12:30 PM [facility] PROVIDER AC[facility] None    Betsey CHRISTELLA Helling, MD

## 2024-09-23 NOTE — Assessment & Plan Note (Signed)
 Subacute, reports allergic reaction to shrimp 1-2 weeks ago in which her lips and tongue swelled and her throat felt itchy. Took benadryl  and symptoms resolved eventually. Lip swelling took 5 or so days to resolve. Counseled her on ED precautions. Prescribed EpiPen, described use. Directed her to online training videos form manufacturer for future reference. Paper referral form completed for [facility], given to RN to fax.

## 2024-09-23 NOTE — Progress Notes (Signed)
 Here for Centering pregnancy 21 4/7.  S/S PTL reviewed and handout given. Patient aware of upcoming appointments. Hulan Parish, RN

## 2024-10-07 ENCOUNTER — Ambulatory Visit: Attending: Medical

## 2024-10-07 DIAGNOSIS — R55 Syncope and collapse: Secondary | ICD-10-CM | POA: Insufficient documentation

## 2024-10-07 LAB — ECHOCARDIOGRAM COMPLETE
AR max vel: 2.5 cm2
AV Area VTI: 2.72 cm2
AV Area mean vel: 2.52 cm2
AV Mean grad: 4 mmHg
AV Peak grad: 7.6 mmHg
Ao pk vel: 1.38 m/s
Area-P 1/2: 5.38 cm2
MV M vel: 4.48 m/s
MV Peak grad: 80.3 mmHg
S' Lateral: 3.2 cm

## 2024-10-08 ENCOUNTER — Ambulatory Visit: Payer: Self-pay | Admitting: Medical

## 2024-10-11 ENCOUNTER — Ambulatory Visit: Admission: RE | Admit: 2024-10-11 | Source: Ambulatory Visit

## 2024-10-12 ENCOUNTER — Ambulatory Visit: Attending: Medical | Admitting: Medical

## 2024-10-12 NOTE — Progress Notes (Deleted)
  Cardiology Office Note   Date:  10/12/2024  ID:  Tara Pierce, DOB 28-Jun-2006, MRN [id number] PCP: Department, [facility] Providers Cardiologist:  None { Click to update primary MD,subspecialty MD or APP then REFRESH:1}    History of Present Illness Tara Pierce is a 18 y.o. female with a h/o migraines who is at [redacted] weeks gestation who presents as a new patient for syncope and chest pain.   The patient was seen in the ER 08/24/24 for near syncope, back pain, and SOB. She was [redacted] weeks pregnant. She passed out on the cough. Vitals were stable. Ddimer 1.24. Chest CTA, DVT US , MR head unremarkable. HS trop negative x 2. She was referred to cardiology.   Patient was seen 08/30/2024 as a new patient for syncope and chest pain.  Patient had syncope while she was cooking.  She felt dizzy and lightheaded prior to the event.  Orthostatics were normal, but heart rate increased by 30 points from sitting to standing.  2-week heart monitor and echocardiogram were ordered.  Heart monitor showed normal sinus rhythm with an average heart rate of 96 bpm, rare ectopy.  Echo showed EF 60 to 65%.  Today,  ROS: ***  Studies Reviewed      *** Risk Assessment/Calculations {Does this patient have ATRIAL FIBRILLATION?[phone]} No BP recorded.  {Refresh Note OR Click here to enter BP  :1}***       Physical Exam VS:  LMP 02/14/2024 (Within Weeks) Comment: patient unsure when she had LMP/taking ocp intermittently/had negative UPT on 04/12/24       Wt Readings from Last 3 Encounters:  09/23/24 216 lb 6.4 oz (98.2 kg)  08/30/24 212 lb 6.4 oz (96.3 kg) (98%, Z= 2.12)*  08/26/24 212 lb 9.6 oz (96.4 kg) (98%, Z= 2.12)*   * Growth percentiles are based on CDC (Girls, 2-20 Years) data.    GEN: Well nourished, well developed in no acute distress NECK: No JVD; No carotid bruits CARDIAC: ***RRR, no murmurs, rubs, gallops RESPIRATORY:  Clear to  auscultation without rales, wheezing or rhonchi  ABDOMEN: Soft, non-tender, non-distended EXTREMITIES:  No edema; No deformity   ASSESSMENT AND PLAN ***    {Are you ordering a CV Procedure (e.g. stress test, cath, DCCV, TEE, etc)?   Press F2        :[id number]}  Dispo: ***  Signed, Yvette Roark VEAR Fishman, PA-C

## 2024-10-14 ENCOUNTER — Encounter: Payer: Self-pay | Admitting: Medical

## 2024-10-19 ENCOUNTER — Ambulatory Visit
Admission: RE | Admit: 2024-10-19 | Discharge: 2024-10-19 | Disposition: A | Discharge status: Home / Self Care | Source: Ambulatory Visit | Attending: Family Medicine | Admitting: Family Medicine

## 2024-10-19 DIAGNOSIS — Z3A25 25 weeks gestation of pregnancy: Secondary | ICD-10-CM | POA: Insufficient documentation

## 2024-10-19 DIAGNOSIS — O321XX0 Maternal care for breech presentation, not applicable or unspecified: Secondary | ICD-10-CM | POA: Insufficient documentation

## 2024-10-19 DIAGNOSIS — Z3400 Encounter for supervision of normal first pregnancy, unspecified trimester: Secondary | ICD-10-CM | POA: Insufficient documentation

## 2024-10-20 ENCOUNTER — Telehealth: Payer: Self-pay | Admitting: Family Medicine

## 2024-10-21 ENCOUNTER — Ambulatory Visit: Payer: Self-pay | Admitting: Family Medicine

## 2024-10-21 ENCOUNTER — Ambulatory Visit: Admitting: Physician Assistant

## 2024-10-21 ENCOUNTER — Encounter: Payer: Self-pay | Admitting: Physician Assistant

## 2024-10-21 VITALS — BP 124/80 | HR 99 | Wt 218.2 lb

## 2024-10-21 DIAGNOSIS — O99210 Obesity complicating pregnancy, unspecified trimester: Secondary | ICD-10-CM

## 2024-10-21 DIAGNOSIS — O99212 Obesity complicating pregnancy, second trimester: Secondary | ICD-10-CM

## 2024-10-21 DIAGNOSIS — Z3A25 25 weeks gestation of pregnancy: Secondary | ICD-10-CM

## 2024-10-21 DIAGNOSIS — Z3400 Encounter for supervision of normal first pregnancy, unspecified trimester: Secondary | ICD-10-CM

## 2024-10-21 DIAGNOSIS — Z3402 Encounter for supervision of normal first pregnancy, second trimester: Secondary | ICD-10-CM

## 2024-10-21 DIAGNOSIS — Z8669 Personal history of other diseases of the nervous system and sense organs: Secondary | ICD-10-CM

## 2024-10-21 MED ORDER — METOCLOPRAMIDE HCL 10 MG PO TABS: 10.0000 mg | ORAL_TABLET | Freq: Three times a day (TID) | ORAL | 0 refills | Status: AC | PRN

## 2024-10-21 NOTE — Progress Notes (Addendum)
 Here for Centering at 25 4/7. Patient states she has had a migraine for 2 days, had white dots in her vision, took Tylenol , did not help. With migraine she was vomiting.  Aware of [facility] neuro appointment on 12/14/24. Declines flu vaccine.Hulan Parish, RN

## 2024-10-21 NOTE — Progress Notes (Signed)
 [facility] [address], [address] [place] [address] Main phone: [phone]  Prenatal Visit  Subjective:  Tara Pierce is a 18 y.o. G1P0000 at [redacted]w[redacted]d being seen today for ongoing prenatal care.  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 Minimal relief with Tylenol . Today feels tired.  Contractions: Not present. Vag. Bleeding: None.  Movement: Present. Denies leaking of fluid/ROM.   The following portions of the patient's history were reviewed and updated as appropriate: allergies, current medications, past family history, past medical history, past social history, past surgical history and problem list. Problem list updated.  Objective:   Vitals:   10/21/24 1253  BP: 124/80  Pulse: 99  Weight: 218 lb 3.2 oz (99 kg)    Fetal Status: Fetal Heart Rate (bpm): 140 Fundal Height: 26 cm Movement: Present     General:  Alert, oriented and cooperative. Patient is in no acute distress.  Skin: Skin is warm and dry. No rash noted.   Cardiovascular: Normal heart rate noted  Respiratory: Normal respiratory effort, no problems with respiration noted  Abdomen: Soft, gravid, appropriate for gestational age.  Pain/Pressure: Absent     Pelvic: Cervical exam deferred        Extremities: Normal range of motion.  Edema: None  Mental Status: Normal mood and affect. Normal behavior. Normal judgment and  thought content.   Assessment and Plan:  Pregnancy: G1P0000 at [redacted]w[redacted]d  1. [redacted] weeks gestation of pregnancy (Primary) RV in 4 weeks for next CP session.  2. Supervision of normal first pregnancy, antepartum Continue prenatal vitamins. Centering Pregnancy, Session#4: Reviewed resources in [employer].   Facilitated discussion today:  Family dynamics/environment, family planning postpartum.  Fundal height and FHR appropriate today unless noted otherwise in plan. Patient to continue group care.   3. History of migraine Migraines predate pregnancy, and are debilitating and have strong N/V component. These severe headaches occur about every 3 weeks during this pregnancy so far. Current migraine is mostly resolved. Discussed treatment options with Dr. Macario. Plan to avoid higher dose NSAIDs and triptans for now. Rx Reglan  to take with acetaminophen  650-1000mg  every 8 hours as needed for migraine HA. Enc to keep 12/14/24 neurology appt as sched. - metoCLOPramide  (REGLAN ) 10 MG tablet; Take 1 tablet (10 mg total) by mouth every 8 (eight) hours as needed for nausea or vomiting (migraine). Take with 650mg  to 1,000mg  of tylenol .  Dispense: 30 tablet; Refill: 0  4. Obesity affecting pregnancy, antepartum, unspecified obesity type TWG 18 lbs. Enc to continue daily low-dose aspirin .   Preterm labor symptoms and general obstetric precautions including but not limited to vaginal bleeding, contractions, leaking of fluid and fetal movement were reviewed in detail with the patient. Please refer to After Visit Summary for other counseling recommendations.  Return in about 4 weeks (around 11/18/2024) for Routine prenatal care, Centering Pregnancy.  Future Appointments  Date Time Provider Department Center  11/04/2024  1:20 PM [facility]  PROVIDER [facility] None  11/18/2024  1:20 PM [facility] PROVIDER [facility] None  12/03/2024 12:30 PM [facility] PROVIDER [facility] None  12/16/2024 12:30 PM [facility] PROVIDER [facility] None  12/30/2024  12:30 PM [facility] PROVIDER [facility] None  01/13/2025 12:30 PM [facility] PROVIDER [facility] None    Saul Irvine, PA-C

## 2024-10-21 NOTE — Progress Notes (Signed)
 Reviewed anatomy completion scan from 11/18. All anatomy appears normal, no concerns identified. This completes the anatomy survey. Chart updated. To be discussed with patient at next prenatal appointment.   Dorothyann Helling, MD 10/21/24  1:56 PM

## 2024-10-22 ENCOUNTER — Telehealth: Payer: Self-pay | Admitting: Family Medicine

## 2024-10-25 ENCOUNTER — Telehealth: Payer: Self-pay

## 2024-10-25 NOTE — Telephone Encounter (Signed)
 TC to patient to ask if she has called for an appointment to [facility]. They were unable to reach her and L. Dala let her know to call for appointment. LM with number to call.Hulan Parish, RN

## 2024-11-04 ENCOUNTER — Ambulatory Visit: Admitting: Physician Assistant

## 2024-11-04 ENCOUNTER — Encounter: Payer: Self-pay | Admitting: Physician Assistant

## 2024-11-04 VITALS — BP 135/87 | HR 120 | Wt 223.8 lb

## 2024-11-04 DIAGNOSIS — O99210 Obesity complicating pregnancy, unspecified trimester: Secondary | ICD-10-CM

## 2024-11-04 DIAGNOSIS — Z3A27 27 weeks gestation of pregnancy: Secondary | ICD-10-CM

## 2024-11-04 DIAGNOSIS — Z3400 Encounter for supervision of normal first pregnancy, unspecified trimester: Secondary | ICD-10-CM

## 2024-11-04 LAB — HEMOGLOBIN, FINGERSTICK: Hemoglobin: 11.8 g/dL (ref 11.1–15.9)

## 2024-11-04 NOTE — Progress Notes (Signed)
 [facility] [address] [place] [address] Main phone: [phone]  Prenatal Visit  Subjective:  Tara Pierce is a 18 y.o. G1P0000 at [redacted]w[redacted]d being seen today for ongoing prenatal care.  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  Contractions: Not present. Vag. Bleeding: None.  Movement: Present. Denies leaking of fluid/ROM.   The following portions of the patient's history were reviewed and updated as appropriate: allergies, current medications, past family history, past medical history, past social history, past surgical history and problem list. Problem list updated.  Objective:   Vitals:   11/04/24 1249  BP: 135/87  Pulse: (!) 120  Weight: 223 lb 12.8 oz (101.5 kg)    Fetal Status: Fetal Heart Rate (bpm): 150 Fundal Height: 29 cm Movement: Present     General:  Alert, oriented and cooperative. Patient is in no acute distress.  Skin: Skin is warm and dry. No rash noted.   Cardiovascular: Normal heart rate noted  Respiratory: Normal respiratory effort, no problems with respiration noted  Abdomen: Soft, gravid, appropriate for gestational age.  Pain/Pressure: Absent     Pelvic: Cervical exam deferred        Extremities: Normal range of motion.  Edema: None  Mental Status: Normal mood and affect. Normal behavior. Normal judgment and thought content.   Assessment and Plan:  Pregnancy: G1P0000 at [redacted]w[redacted]d  1. [redacted] weeks gestation of pregnancy  (Primary) RV in 2 weeks.  2. Supervision of normal first pregnancy, antepartum Routine 28-week labs/immunization today. Enc to try pillow between knees when sleeping on side at night for hip pain. Signs and symptoms of preeclampsia were verbally reviewed with warning signs, when and how to call. A written handout with tips on how to take your blood pressure, as well as warning signs was provided to the patient.  Centering Pregnancy, Session#5: Reviewed resources in [employer]. Given Centering water bottle. Made rice socks  Facilitated discussion today:  Stages of Labor, Sign of labor, labor positions, coping strategies.   Fundal height and FHR appropriate today unless noted otherwise in plan. Patient to continue group care.   - HIV-1/HIV-2 Qualitative RNA - RPR W/RFLX TO RPR TITER, TREPONEMAL AB, SCREEN AND DIAGNOSIS - Glucose, 1 hour - Hemoglobin, fingerstick - Tdap vaccine greater than or equal to 7yo IM  3. Obesity affecting pregnancy, antepartum, unspecified obesity type 23 lb TWG, over preg goal of 11-20 lb. Enc to continue daily aspirin  and follow MNT dietary guidelines.   Preterm labor symptoms and general obstetric precautions including but not limited to vaginal bleeding, contractions, leaking of fluid and fetal movement were reviewed in detail with the patient. Please refer to After Visit Summary for other counseling recommendations.  Return in about 2 weeks (around 11/18/2024) for Routine prenatal care, Centering Pregnancy.  Future Appointments  Date Time Provider Department Center  11/18/2024  1:20 PM [facility] PROVIDER [facility] None  12/03/2024 12:30 PM [facility] PROVIDER [facility] None  12/16/2024 12:30 PM [facility] PROVIDER [facility] None  12/30/2024 12:30 PM [facility] PROVIDER AC[facility]  None  01/13/2025 12:30 PM [facility] PROVIDER [facility] None    Saul Irvine, PA-C

## 2024-11-04 NOTE — Progress Notes (Addendum)
 Here for Centering at 27 4/7. 28 week labs today and Tdap. Patient needs to be to lab by 1:55..States she called [facility] yesterday and will call again.Hulan Parish, RN   Tdap given, tolerated well, VIS given , NCIR mailed to client. Hgb= 11.8 today. Hulan Parish, RN

## 2024-11-06 LAB — SYPHILIS: RPR W/REFLEX TO RPR TITER AND TREPONEMAL ANTIBODIES, TRADITIONAL SCREENING AND DIAGNOSIS ALGORITHM: RPR Ser Ql: NONREACTIVE

## 2024-11-06 LAB — GLUCOSE, 1 HOUR GESTATIONAL: Gestational Diabetes Screen: 120 mg/dL (ref 70–139)

## 2024-11-06 LAB — HIV-1/HIV-2 QUALITATIVE RNA
HIV-1 RNA, Qualitative: NONREACTIVE
HIV-2 RNA, Qualitative: NONREACTIVE

## 2024-11-08 ENCOUNTER — Ambulatory Visit: Payer: Self-pay | Admitting: Family Medicine

## 2024-11-08 ENCOUNTER — Telehealth: Payer: Self-pay | Admitting: Family Medicine

## 2024-11-08 DIAGNOSIS — Z3400 Encounter for supervision of normal first pregnancy, unspecified trimester: Secondary | ICD-10-CM

## 2024-11-08 NOTE — Progress Notes (Signed)
 Reviewed routine 28 week labs. Negative 1 hr GTT, HIV, RPR, and anemia screen. No action needed at this time. To be discussed at next routine prenatal appointment.   Dorothyann Helling, MD 11/08/24  11:33 AM

## 2024-11-17 ENCOUNTER — Telehealth: Payer: Self-pay | Admitting: Family Medicine

## 2024-11-17 DIAGNOSIS — R55 Syncope and collapse: Secondary | ICD-10-CM

## 2024-11-18 ENCOUNTER — Ambulatory Visit: Admitting: Physician Assistant

## 2024-11-18 ENCOUNTER — Encounter: Payer: Self-pay | Admitting: Physician Assistant

## 2024-11-18 VITALS — BP 128/86 | HR 99 | Wt 221.5 lb

## 2024-11-18 DIAGNOSIS — O99213 Obesity complicating pregnancy, third trimester: Secondary | ICD-10-CM

## 2024-11-18 DIAGNOSIS — Z3A29 29 weeks gestation of pregnancy: Secondary | ICD-10-CM

## 2024-11-18 DIAGNOSIS — Z8669 Personal history of other diseases of the nervous system and sense organs: Secondary | ICD-10-CM

## 2024-11-18 DIAGNOSIS — F419 Anxiety disorder, unspecified: Secondary | ICD-10-CM

## 2024-11-18 DIAGNOSIS — Z3400 Encounter for supervision of normal first pregnancy, unspecified trimester: Secondary | ICD-10-CM

## 2024-11-18 DIAGNOSIS — O99210 Obesity complicating pregnancy, unspecified trimester: Secondary | ICD-10-CM

## 2024-11-18 DIAGNOSIS — Z3403 Encounter for supervision of normal first pregnancy, third trimester: Secondary | ICD-10-CM

## 2024-11-18 NOTE — Progress Notes (Signed)
 [facility] [address] [place] [address] Main phone: [phone]  Prenatal Visit  Subjective:  Tara Pierce is a 18 y.o. G1P0000 at [redacted]w[redacted]d being seen today for ongoing prenatal care.  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  Contractions: Not present. Vag. Bleeding: None.  Movement: Present. Also reports a hospital visit last week for migraine HA. Feeling well since. Denies leaking of fluid/ROM.   The following portions of the patient's history were reviewed and updated as appropriate: allergies, current medications, past family history, past medical history, past social history, past surgical history and problem list. Problem list updated.  Objective:   Vitals:   11/18/24 1237  BP: 128/86  Pulse: 99  Weight: 221 lb 8 oz (100.5 kg)   Total weight gain from pre-pregnancy weight: 21 lb 8 oz (9.752 kg)  Fetal Status: Fetal Heart Rate (bpm): 142 Fundal Height: 31 cm Movement: Present    Fundal height trends reviewed - appropriate for EGA  General:  Alert, oriented and cooperative. Patient is in no acute distress.  Skin: Skin is warm and dry. No rash noted.   Cardiovascular: Normal heart rate noted  Respiratory: Normal respiratory effort, no problems with respiration noted  Abdomen: Soft, gravid, appropriate for gestational age.  Pain/Pressure: Absent     Pelvic:  Cervical exam deferred        Extremities: Normal range of motion.  Edema: None  Mental Status: Normal mood and affect. Normal behavior. Normal judgment and thought content.   Assessment and Plan:  Pregnancy: G1P0000 at [redacted]w[redacted]d  1. [redacted] weeks gestation of pregnancy (Primary) RV in 2 weeks.  2. Supervision of normal first pregnancy, antepartum Continue PNV. Centering Pregnancy, Session#6: Reviewed resources in [employer].   Facilitated discussion today:  Care of mother and baby immediately after delivery, skin to skin for breastfeeding initiation, safe sleep and carseat use.  Fundal height and FHR appropriate today unless noted otherwise in plan. Patient to continue group care.    3. Obesity affecting pregnancy, antepartum, unspecified obesity type TWG 21 lb, over desired for total preg of 11-20 lb. Continue prenatal vitamins. Enc exercise.  4. Anxious mood Agrees to LCSW referral for counseling. - Ambulatory referral to Behavioral Health  5. History of migraine Had hosp eval of migraine last week, resolved with Reglan  tx. I reviewed records and note that she did not try Tylenol  prior to eval. BH eval might be helpful.   Preterm labor symptoms and general obstetric precautions including but not limited to vaginal bleeding, contractions, leaking of fluid and fetal movement were reviewed in detail with the patient. Please refer to After Visit Summary for other counseling recommendations.  Return in about 2 weeks (around 12/02/2024) for Routine prenatal care, Centering Pregnancy.  Future Appointments  Date Time Provider Department Center  12/03/2024 12:30 PM [facility] PROVIDER [facility] None  12/16/2024 12:30 PM [facility] PROVIDER [facility] None  12/30/2024 12:30 PM [facility]  PROVIDER [facility] None  01/13/2025 12:30 PM [facility] PROVIDER [facility] None    Saul Irvine, PA-C

## 2024-12-01 ENCOUNTER — Telehealth: Payer: Self-pay | Admitting: Family Medicine

## 2024-12-03 ENCOUNTER — Ambulatory Visit

## 2024-12-03 VITALS — BP 114/80 | HR 111 | Wt 226.0 lb

## 2024-12-03 DIAGNOSIS — Z3A31 31 weeks gestation of pregnancy: Secondary | ICD-10-CM

## 2024-12-03 DIAGNOSIS — O99213 Obesity complicating pregnancy, third trimester: Secondary | ICD-10-CM

## 2024-12-03 DIAGNOSIS — F419 Anxiety disorder, unspecified: Secondary | ICD-10-CM

## 2024-12-03 DIAGNOSIS — Z8669 Personal history of other diseases of the nervous system and sense organs: Secondary | ICD-10-CM

## 2024-12-03 DIAGNOSIS — Z91013 Allergy to seafood: Secondary | ICD-10-CM

## 2024-12-03 DIAGNOSIS — Z3400 Encounter for supervision of normal first pregnancy, unspecified trimester: Secondary | ICD-10-CM

## 2024-12-03 DIAGNOSIS — O99210 Obesity complicating pregnancy, unspecified trimester: Secondary | ICD-10-CM

## 2024-12-03 DIAGNOSIS — R55 Syncope and collapse: Secondary | ICD-10-CM

## 2024-12-03 DIAGNOSIS — Z3403 Encounter for supervision of normal first pregnancy, third trimester: Secondary | ICD-10-CM
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
## 2024-12-09 ENCOUNTER — Telehealth: Payer: Self-pay

## 2024-12-09 NOTE — Telephone Encounter (Signed)
 Fax received from [facility] with US  appt scheduled for 12/23/24 at 1330. Call to client to ascertain if aware of appt and she stated received call from [facility] with appt. Burnadette Lowers, RN

## 2024-12-10 NOTE — Addendum Note (Signed)
 Addended by: Lenell Mcconnell on: 12/10/2024 02:08 PM   Modules accepted: Orders

## 2024-12-15 ENCOUNTER — Telehealth: Payer: Self-pay | Admitting: Family Medicine

## 2024-12-16 ENCOUNTER — Ambulatory Visit: Payer: Self-pay | Admitting: Licensed Clinical Social Worker

## 2024-12-16 ENCOUNTER — Encounter: Payer: Self-pay | Admitting: Physician Assistant

## 2024-12-16 ENCOUNTER — Ambulatory Visit: Admitting: Physician Assistant

## 2024-12-16 VITALS — BP 126/86 | HR 123 | Wt 232.6 lb

## 2024-12-16 DIAGNOSIS — Z3A33 33 weeks gestation of pregnancy: Secondary | ICD-10-CM

## 2024-12-16 DIAGNOSIS — O99210 Obesity complicating pregnancy, unspecified trimester: Secondary | ICD-10-CM

## 2024-12-16 DIAGNOSIS — Z3400 Encounter for supervision of normal first pregnancy, unspecified trimester: Secondary | ICD-10-CM

## 2024-12-16 DIAGNOSIS — R55 Syncope and collapse: Secondary | ICD-10-CM

## 2024-12-16 DIAGNOSIS — Z91013 Allergy to seafood: Secondary | ICD-10-CM

## 2024-12-16 DIAGNOSIS — Z3403 Encounter for supervision of normal first pregnancy, third trimester: Secondary | ICD-10-CM

## 2024-12-16 DIAGNOSIS — O99213 Obesity complicating pregnancy, third trimester: Secondary | ICD-10-CM

## 2024-12-16 DIAGNOSIS — Z8669 Personal history of other diseases of the nervous system and sense organs: Secondary | ICD-10-CM

## 2024-12-16 NOTE — Progress Notes (Signed)
 Here for Centering at 33 4/7. Wants to think about RSV vaccine. States she has swollen hands and feet.Hulan Parish, RN

## 2024-12-16 NOTE — Progress Notes (Addendum)
 " [facility] [address] [place] [address] Main phone: [phone]  Prenatal Visit  Subjective:  Tara Pierce is a 19 y.o. G1P0000 at [redacted]w[redacted]d being seen today for ongoing prenatal care.  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 She also endorses tightness in her stomach, reports adeuqate hydration 10 water bottles throughout the day. She also had on episode of dizziness today before an after eating, however rest helped resolve dizzy spells. Denies HA, RUQ pain, and visual symptoms.  Contractions: Irregular (BH). Vag. Bleeding: None.  Movement: Present. Pt Denies leaking of fluid/ROM.   The following portions of the patient's history were reviewed and updated as appropriate: allergies, current medications, past family history, past medical history, past social history, past surgical history and problem list. Problem list updated.  Objective:   Vitals:   12/16/24 1240  BP: 126/86  Pulse: (!) 123  Weight: 232 lb 9.6 oz (105.5 kg)   Total weight gain from pre-pregnancy weight: 32 lb 9.6 oz (14.8 kg)  Fetal Status: Fetal Heart Rate (bpm): 142 Fundal Height: 35 cm Movement: Present    Fundal height trends reviewed - appropriate for EGA  General:  Alert, oriented and cooperative. Patient is in no acute distress.  Skin: Skin is warm and dry. No rash noted.    Cardiovascular: Normal heart rate noted  Respiratory: Normal respiratory effort, no problems with respiration noted  Abdomen: Soft, gravid, appropriate for gestational age.  Pain/Pressure: Absent     Pelvic: Cervical exam deferred        Extremities: Normal range of motion.  Edema: None  Mental Status: Normal mood and affect. Normal behavior. Normal judgment and thought content.   Assessment and Plan:  Pregnancy: G1P0000 at [redacted]w[redacted]d  1. [redacted] weeks gestation of pregnancy (Primary) -RV in 2 weeks  2. Supervision of normal first pregnancy, antepartum -Enc pt to continue to stay hydrated and monitor frequency of contractions. -Pt given PTL s/sx such as 6 or more contractions in 1 hour, menstrual cramps, dull persistent backache, pelvic pressure, or VB/LOF. Pt aware if experiencing PTL to report to ED.  -Pt recommended to call and reschedule f/u appt with cardiology, provided contact information for [facility].  -Continues to take PNV without concerns.  -Fetal movement noted, fundal height appropriate for GA.   Centering Pregnancy, Session#8: Reviewed resources in [employer].    Facilitated discussion today:  postpartum mood disorders and keepsake activity.  Fundal height and FHR appropriate today unless noted otherwise in plan. Patient to continue group care.    3. Obesity affecting pregnancy, antepartum, unspecified obesity type -TWG: 32 lb 9.6 oz (14.8 kg)  Has gained 6  lbs in 2 weeks. -Continues to take low-dose ASA.  -Enc to keep US  apt on 12/23/24.  4. History of migraine -No reports of migraine symptoms today.  5. Syncope, unspecified syncope type -No episodes of syncopal  episodes today.  - Neurology f/u appt 12/14/24- per neuro MD note no changes made in medication management, brain MRI and MRV were reviewed from 08/25/24 and was normal. Neurology will continue to monitor for any further symptom changes.  -Has 6 month neurology f/u on 06/14/25  6.  Shrimp allergy -Has Epi-pen on hand hasn't had to use it.   Preterm labor symptoms and general obstetric precautions including but not limited to vaginal bleeding, contractions, leaking of fluid and fetal movement were reviewed in detail with the patient. Please refer to After Visit Summary for other counseling recommendations.  Return in about 2 weeks (around 12/30/2024) for routine prenatal care.  Future Appointments  Date Time Provider Department Center  12/30/2024 12:30 PM [facility] PROVIDER [facility] None  01/13/2025 12:30 PM [facility] PROVIDER [facility] None    Tara Pierce GORMAN Pouch, NP "

## 2024-12-30 ENCOUNTER — Ambulatory Visit: Admitting: Physician Assistant

## 2024-12-30 VITALS — BP 132/87 | HR 116 | Wt 239.6 lb

## 2024-12-30 DIAGNOSIS — O99213 Obesity complicating pregnancy, third trimester: Secondary | ICD-10-CM

## 2024-12-30 DIAGNOSIS — Z3400 Encounter for supervision of normal first pregnancy, unspecified trimester: Secondary | ICD-10-CM

## 2024-12-30 DIAGNOSIS — O99210 Obesity complicating pregnancy, unspecified trimester: Secondary | ICD-10-CM

## 2024-12-30 DIAGNOSIS — Z3403 Encounter for supervision of normal first pregnancy, third trimester: Secondary | ICD-10-CM

## 2024-12-30 DIAGNOSIS — Z3A35 35 weeks gestation of pregnancy: Secondary | ICD-10-CM
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
## 2024-12-30 NOTE — Progress Notes (Signed)
 Here for Centering at 35 4/7. Declines RSV. Appointment made for 1 week for 36 week labs in clinic.SABRAIzetta Parish, RN

## 2025-01-06 ENCOUNTER — Ambulatory Visit

## 2025-01-06 VITALS — BP 129/85 | HR 102 | Temp 98.7°F | Wt 235.0 lb

## 2025-01-06 DIAGNOSIS — R55 Syncope and collapse: Secondary | ICD-10-CM

## 2025-01-06 DIAGNOSIS — Z3400 Encounter for supervision of normal first pregnancy, unspecified trimester: Secondary | ICD-10-CM

## 2025-01-06 DIAGNOSIS — O99210 Obesity complicating pregnancy, unspecified trimester: Secondary | ICD-10-CM

## 2025-01-06 DIAGNOSIS — Z3A36 36 weeks gestation of pregnancy: Secondary | ICD-10-CM

## 2025-01-06 NOTE — Progress Notes (Signed)
 Here for MH RV at 36 4/7. 36 week labs today and packet given. Patient has to leave, her ride needs to get to work. Patient will have labs on 01/13/25 at Centering appointment.Hulan Parish, RN

## 2025-01-06 NOTE — Assessment & Plan Note (Signed)
 One episode of lightheadedness and scotomas. No syncope. Encouraged to log BP when this happens. ED precautions given.

## 2025-01-06 NOTE — Assessment & Plan Note (Addendum)
 Tolerating aspirin  81 mg daily. TWG 35 lb (15.9 kg) thus far. Given pre-gravid BMI >35, had third trimester growth US  2/03 at [facility]; no report available yet.  Plan for weekly NST starting at 37 weeks per ACOG.

## 2025-01-06 NOTE — Assessment & Plan Note (Addendum)
 Doing well. BP mildly elevated but below pre-E threshold. TWG 35 lb (15.9 kg). Taking prenatal vitamin daily and aspirin  81 mg daily. 36 week forms completed: CMHRP risk screens, abuse screen. EPDS score 9. Already established with CMHRP. Next prenatal appointment in 1 weeks. Has to leave without collecting GBS and G/C swabs, will collect after Centering next week.   - Pre-eclampsia warning signs: Signs and symptoms of preeclampsia were verbally reviewed with warning signs, when and how to call. A written handout with tips on how to take your blood pressure, as well as warning signs was provided to the patient.
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
## 2025-01-13 ENCOUNTER — Ambulatory Visit

## 2025-01-30 ENCOUNTER — Inpatient Hospital Stay: Admit: 2025-01-30
